# Patient Record
Sex: Male | Born: 1960 | Race: White | Hispanic: No | Marital: Married | Smoking: Never smoker
Health system: Southern US, Community
[De-identification: ages and names within clinical notes are randomized; demographics above are authoritative.]

## PROBLEM LIST (undated history)

## (undated) DIAGNOSIS — E785 Hyperlipidemia, unspecified: Secondary | ICD-10-CM

## (undated) HISTORY — PX: SHOULDER ARTHROSCOPY: SHX128

## (undated) HISTORY — PX: CHOLECYSTECTOMY: SHX55

## (undated) HISTORY — PX: VASECTOMY: SHX75

## (undated) HISTORY — DX: Hyperlipidemia, unspecified: E78.5

---

## 2009-06-15 ENCOUNTER — Emergency Department: Payer: Self-pay | Admitting: Emergency Medicine

## 2009-06-23 ENCOUNTER — Emergency Department: Payer: Self-pay | Admitting: Unknown Physician Specialty

## 2009-11-28 ENCOUNTER — Encounter: Admission: RE | Admit: 2009-11-28 | Discharge: 2009-11-28 | Payer: Self-pay | Admitting: Family Medicine

## 2009-12-18 ENCOUNTER — Ambulatory Visit (HOSPITAL_COMMUNITY): Admission: RE | Admit: 2009-12-18 | Discharge: 2009-12-18 | Payer: Self-pay | Admitting: Gastroenterology

## 2010-01-02 ENCOUNTER — Encounter: Admission: RE | Admit: 2010-01-02 | Discharge: 2010-01-02 | Payer: Self-pay | Admitting: Family Medicine

## 2010-10-02 ENCOUNTER — Ambulatory Visit (HOSPITAL_COMMUNITY)
Admission: RE | Admit: 2010-10-02 | Discharge: 2010-10-02 | Payer: Self-pay | Source: Home / Self Care | Attending: General Surgery | Admitting: General Surgery

## 2010-10-24 ENCOUNTER — Ambulatory Visit (HOSPITAL_COMMUNITY)
Admission: RE | Admit: 2010-10-24 | Discharge: 2010-10-24 | Payer: Self-pay | Source: Home / Self Care | Attending: General Surgery | Admitting: General Surgery

## 2010-11-22 NOTE — Op Note (Signed)
[name], Tyler Wyatt         ACCOUNT NO.:  [id number]  MEDICAL RECORD NO.:  [id number]          PATIENT TYPE:  AMB  LOCATION:  SDS                          FACILITY:  [facility]  PHYSICIAN:  Cherylynn Ridges, M.D.    DATE OF BIRTH:  [birth year]  DATE OF PROCEDURE:  10/24/2010 DATE OF DISCHARGE:  10/24/2010                              OPERATIVE REPORT   PREOPERATIVE DIAGNOSIS:  Symptomatic biliary dyskinesia.  POSTOPERATIVE DIAGNOSIS:  Symptomatic biliary dyskinesia.  PROCEDURE:  Laparoscopic cholecystectomy.  SURGEON:  Cherylynn Ridges, MD  ASSISTANT:  Gabrielle Dare. Janee Morn, MD  ANESTHESIA:  General endotracheal.  ESTIMATED BLOOD LOSS:  Less than 20 mL.  COMPLICATIONS:  None.  CONDITION:  Stable.  SPECIMEN:  Gallbladder with contents.  INDICATIONS FOR OPERATION:  The patient has been seen over the past several months for what was thought to be biliary dyskinesia with an initial HIDA scan demonstrating an ejection fraction of about 33-34%, but a repeat showing an ejection fraction less than 20%.  He now comes in for elective laparoscopic cholecystectomy.  FINDINGS:  No acute inflammation.  The patient had a very small cystic duct which was difficult to cannulate and therefore cholangiogram was not performed, however, he did have normal preoperative liver function tests.  OPERATION:  The patient was taken to the operating room and placed on table in supine position.  After an adequate general endotracheal anesthetic was administered, he was prepped and draped in the usual sterile manner exposing the midline and the abdomen.  An infraumbilical midline incision was made using a #15 blade and taken down to the midline fascia.  We incised the fascia with a #15 blade and grabbed the edges with the Kocher clamps tenting up while we bluntly dissected into the peritoneal cavity using Kelly clamp.  Once we were in the peritoneal cavity, a purse-string suture of 0 Vicryl was  passed around the fascial opening securing in a Hasson cannula which was subsequently passed.  We insufflated carbon dioxide gas up to a maximal intraabdominal pressure of 50 mmHg.  The patient was placed in reverse Trendelenburg, the left side was tilted down.  We passed two right upper quadrant 5-mm cannulas and a subxiphoid 5-mm cannula under direct vision.  Once this was done, we proceeded with the dissection.  The ground gallbladder was grasped with a ratcheted grasper towards the anterior abdominal wall and right upper quadrant.  We dissected out the peritoneum overlying the triangle of Calot and hepatic duodenal triangle.  We isolated the cystic duct and cystic artery.  The artery was clipped doubly on the remaining side and distally towards the gallbladder x1 and transected.  The cystic duct was clamped along the gallbladder side.  We made a cholecystodochotomy which showed a lumen with some small bilious drainage.  However, it was too small in order to cannulate with a Cook catheter which had been passed through the anterior abdominal wall.  With the patient having normal preoperative liver function tests, it was felt better not to cause a tear of the duct or any iatrogenic injury.  Therefore, we abandoned the cholangiogram, placed distal clips x3, and transected the cystic  duct.  We dissected out the gallbladder from its bed with minimal difficulty, although there was perforation of spillage of clear bile which was subsequently irrigated and aspirated away.  Approximately 2 liters of saline were used to do so.  Once along the gallbladder was completely detached from the liver, we pulled it to retrieve it from the infraumbilical cannula site with minimal difficulty.  We closed off the fascia at the infraumbilical site using the purse-string suture which was in place.  We aspirated and irrigated with 2 liters of saline.  Once this was done and effluent was clear, we aspirated  all fluid and gas from above the liver and removed all cannulas.  Marcaine 0.25% with epi was injected at all sites.  The infraumbilical skin site was closed using running subcuticular stitch of 4-0 Monocryl. Dermabond, Steri-Strips, and Tegaderm were used to complete all dressings.  All needle counts, sponge counts, and instrument counts were correct.     Cherylynn Ridges, M.D.     JOW/MEDQ  D:  10/24/2010  T:  10/24/2010  Job:  [id number]  cc:   Dr. Clovis Riley  Electronically Signed by Jimmye Norman M.D. on 11/20/2010 08:07:07 AM

## 2010-12-20 IMAGING — CR DG RIBS W/ CHEST 3+V*R*
3 series · 3 of 3 positions shown · non-contrast
Comparison: None.

CLINICAL DATA: Right rib pain.

RIGHT RIBS AND CHEST - 3+ VIEW

[w chest pa]
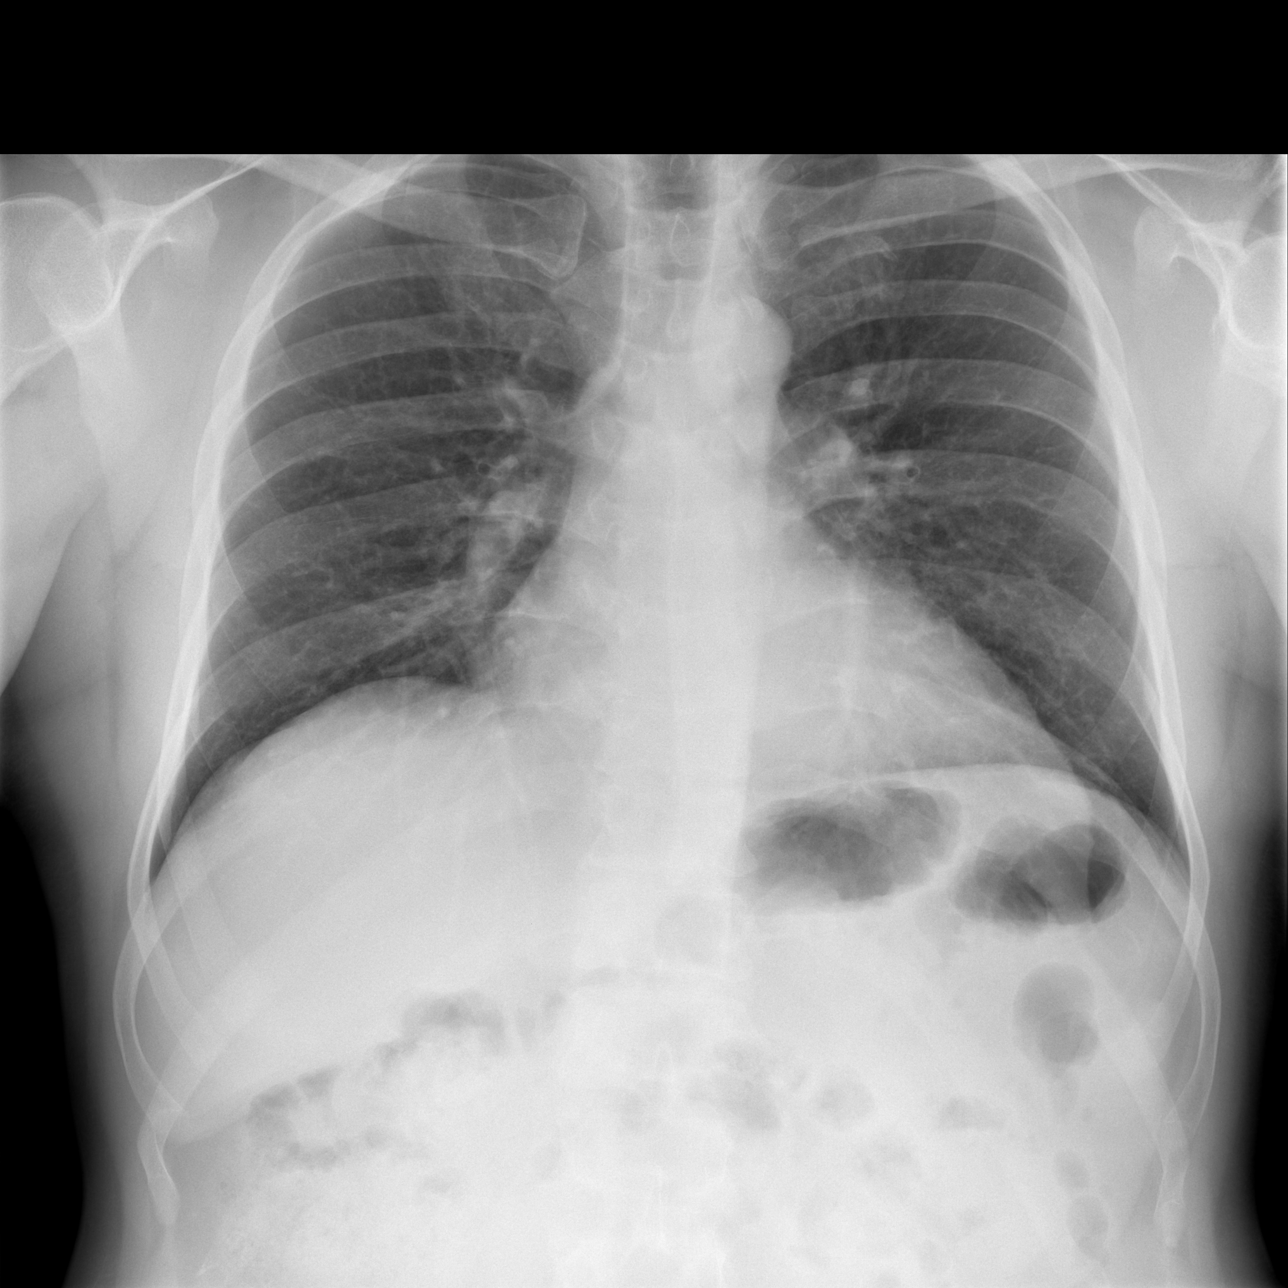

[w ribs ap/pa lower right *]
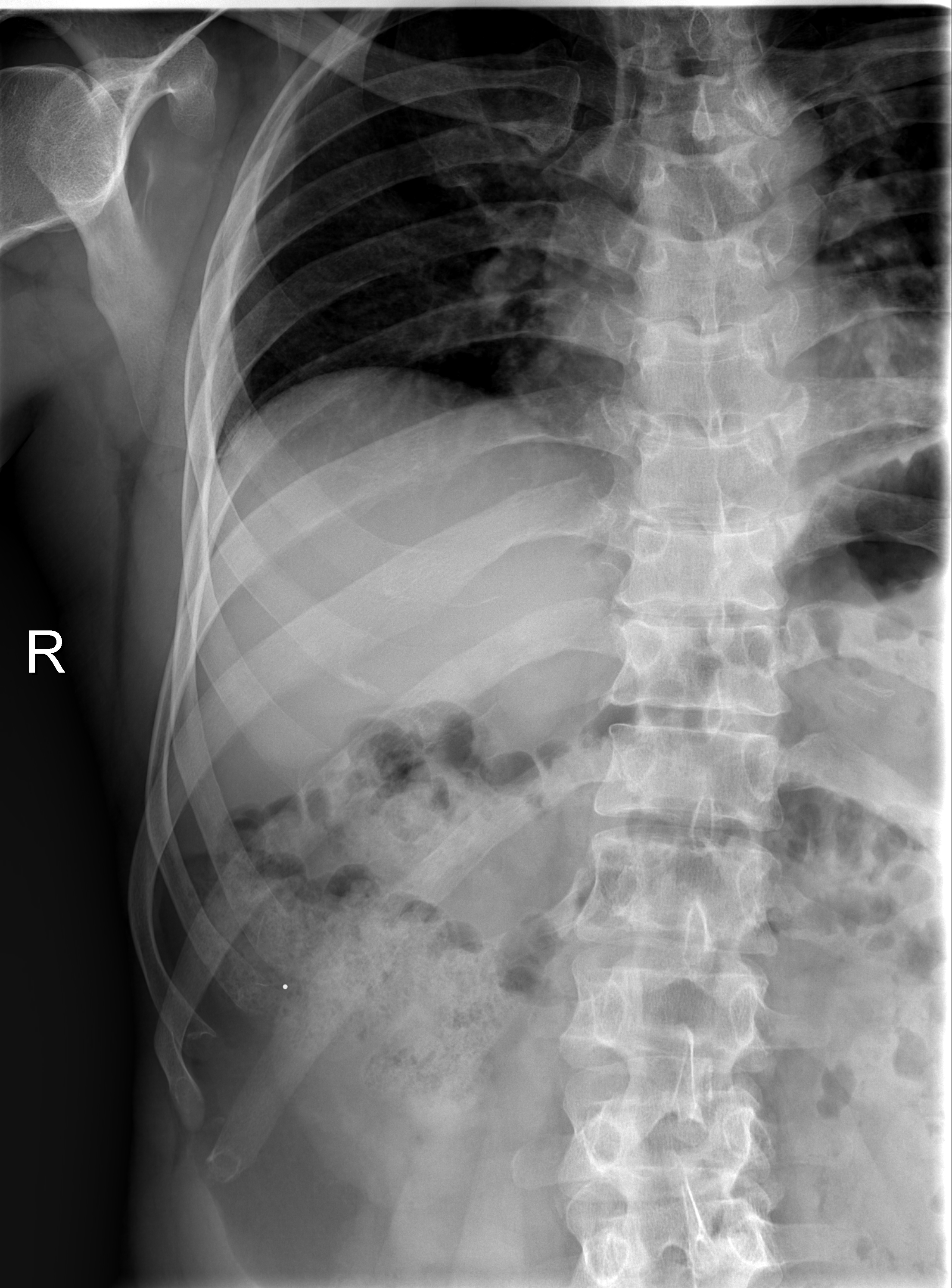

[w ribs oblique right *]
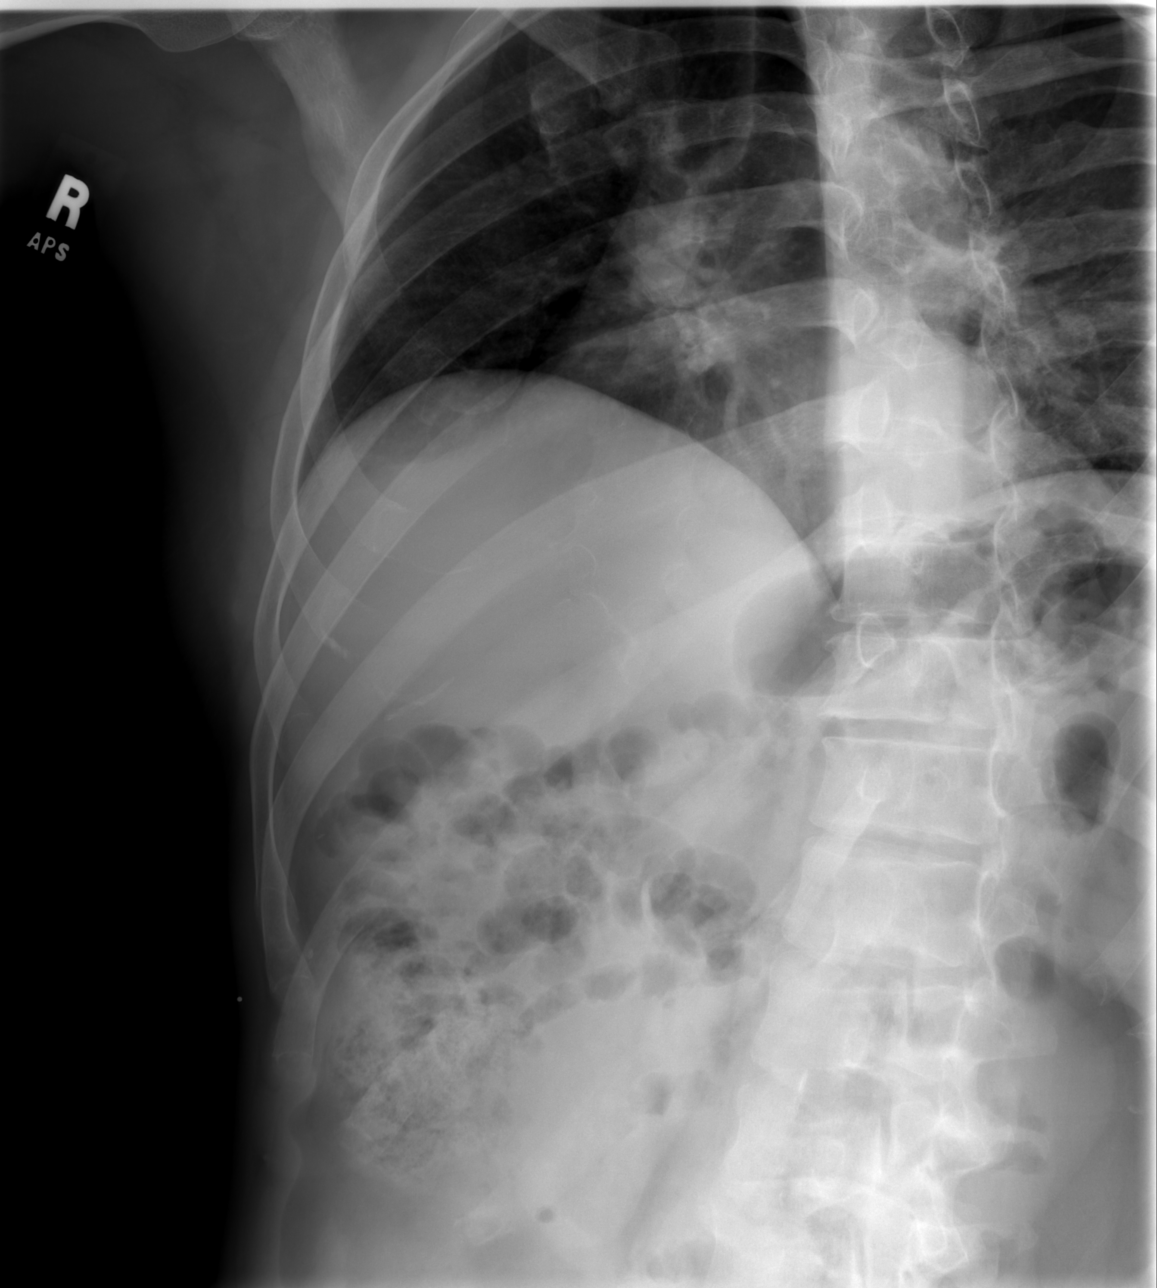

[3 of 3 positions shown; findings below may reference images not displayed]

FINDINGS: No fractures or other bone lesions are seen involve the
right ribs.  No evidence of pneumothorax or pleural effusion.  Both
lungs are clear.  Heart size is normal.
IMPRESSION: Negative.

## 2011-01-06 LAB — CBC
MCH: 29.7 pg (ref 26.0–34.0)
MCHC: 34.6 g/dL (ref 30.0–36.0)
Platelets: 199 10*3/uL (ref 150–400)
RDW: 11.9 % (ref 11.5–15.5)

## 2011-01-06 LAB — COMPREHENSIVE METABOLIC PANEL
AST: 20 U/L (ref 0–37)
Albumin: 4 g/dL (ref 3.5–5.2)
CO2: 26 mEq/L (ref 19–32)
Calcium: 9.5 mg/dL (ref 8.4–10.5)
Creatinine, Ser: 1.07 mg/dL (ref 0.4–1.5)
GFR calc Af Amer: >60 mL/min (ref >60)
GFR calc non Af Amer: >60 mL/min (ref >60)

## 2011-01-06 LAB — DIFFERENTIAL
Eosinophils Relative: 3 % (ref 0–5)
Lymphocytes Relative: 28 % (ref 12–46)
Lymphs Abs: 2.3 10*3/uL (ref 0.7–4.0)

## 2011-01-06 LAB — SURGICAL PCR SCREEN: Staphylococcus aureus: NEGATIVE

## 2011-10-04 IMAGING — CR DG CHEST 2V
2 series · 2 of 2 positions shown · non-contrast
Comparison: None

CLINICAL DATA: Preadmit biliary dyskinesia.

CHEST - 2 VIEW

[view not recorded (1 of 2)]
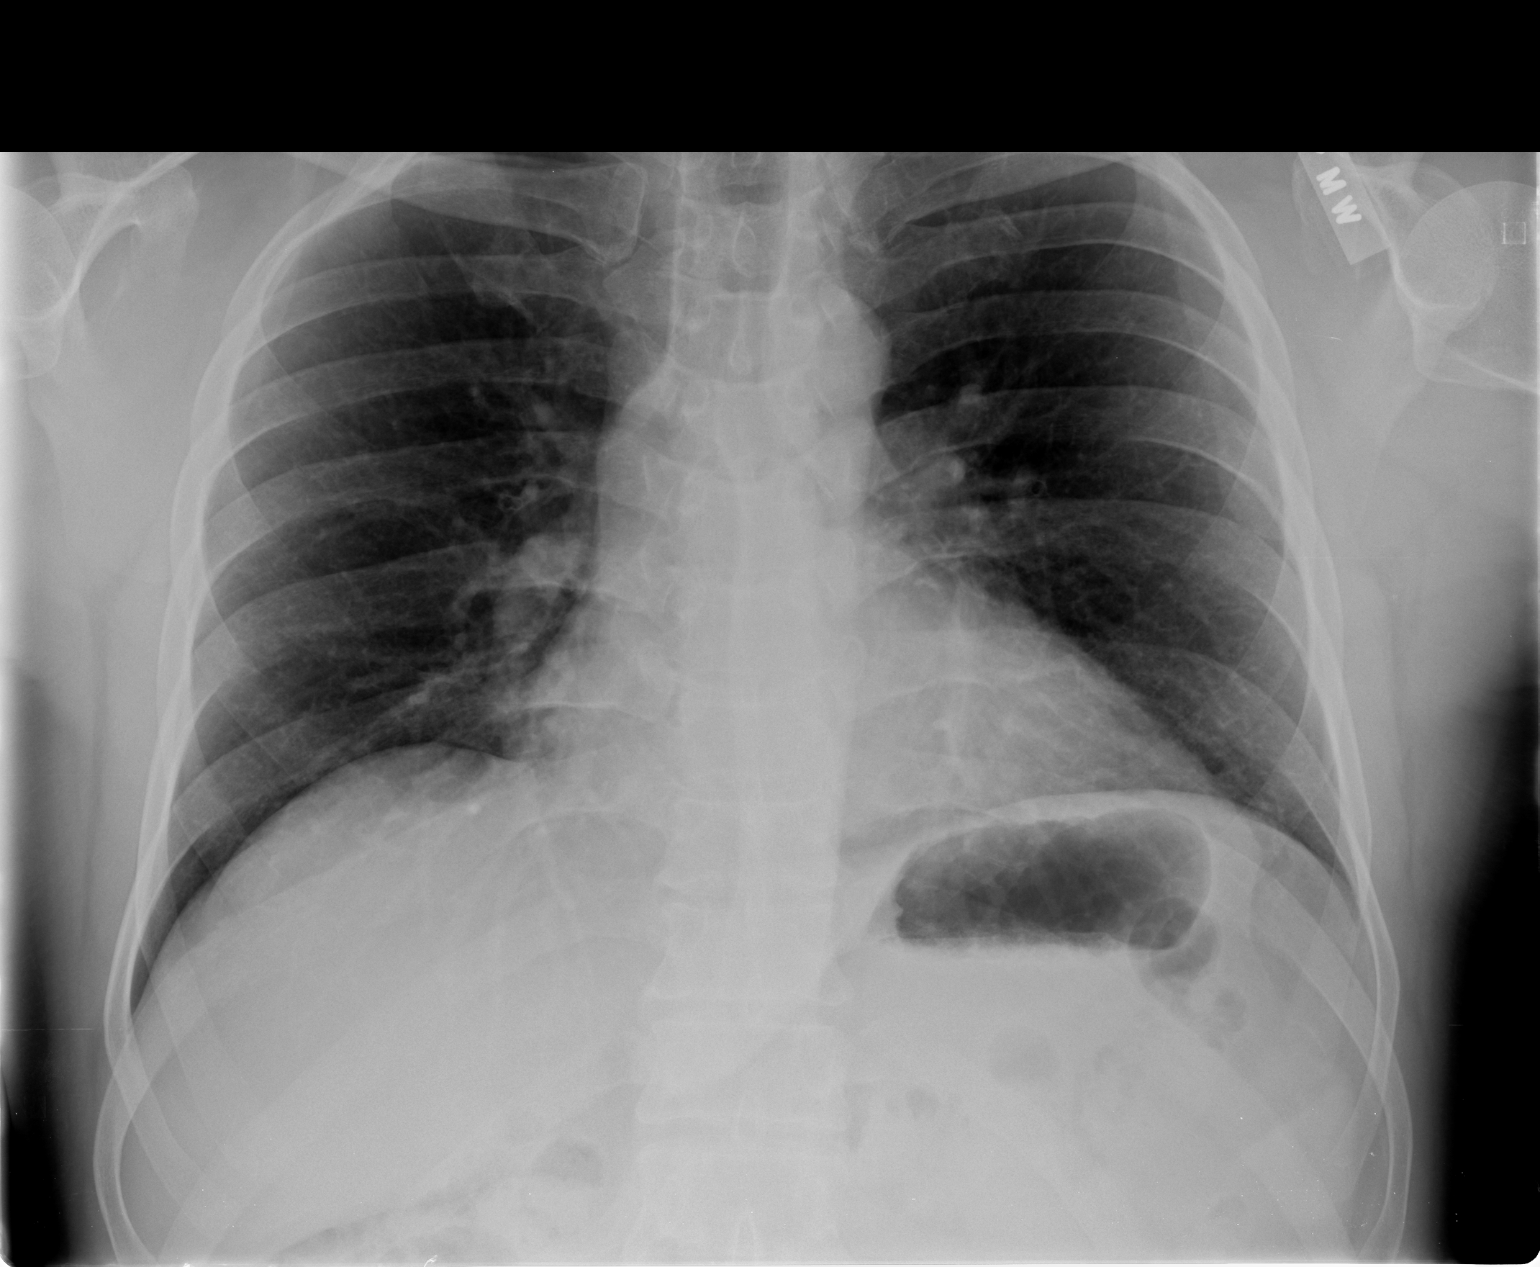

[view not recorded (2 of 2)]
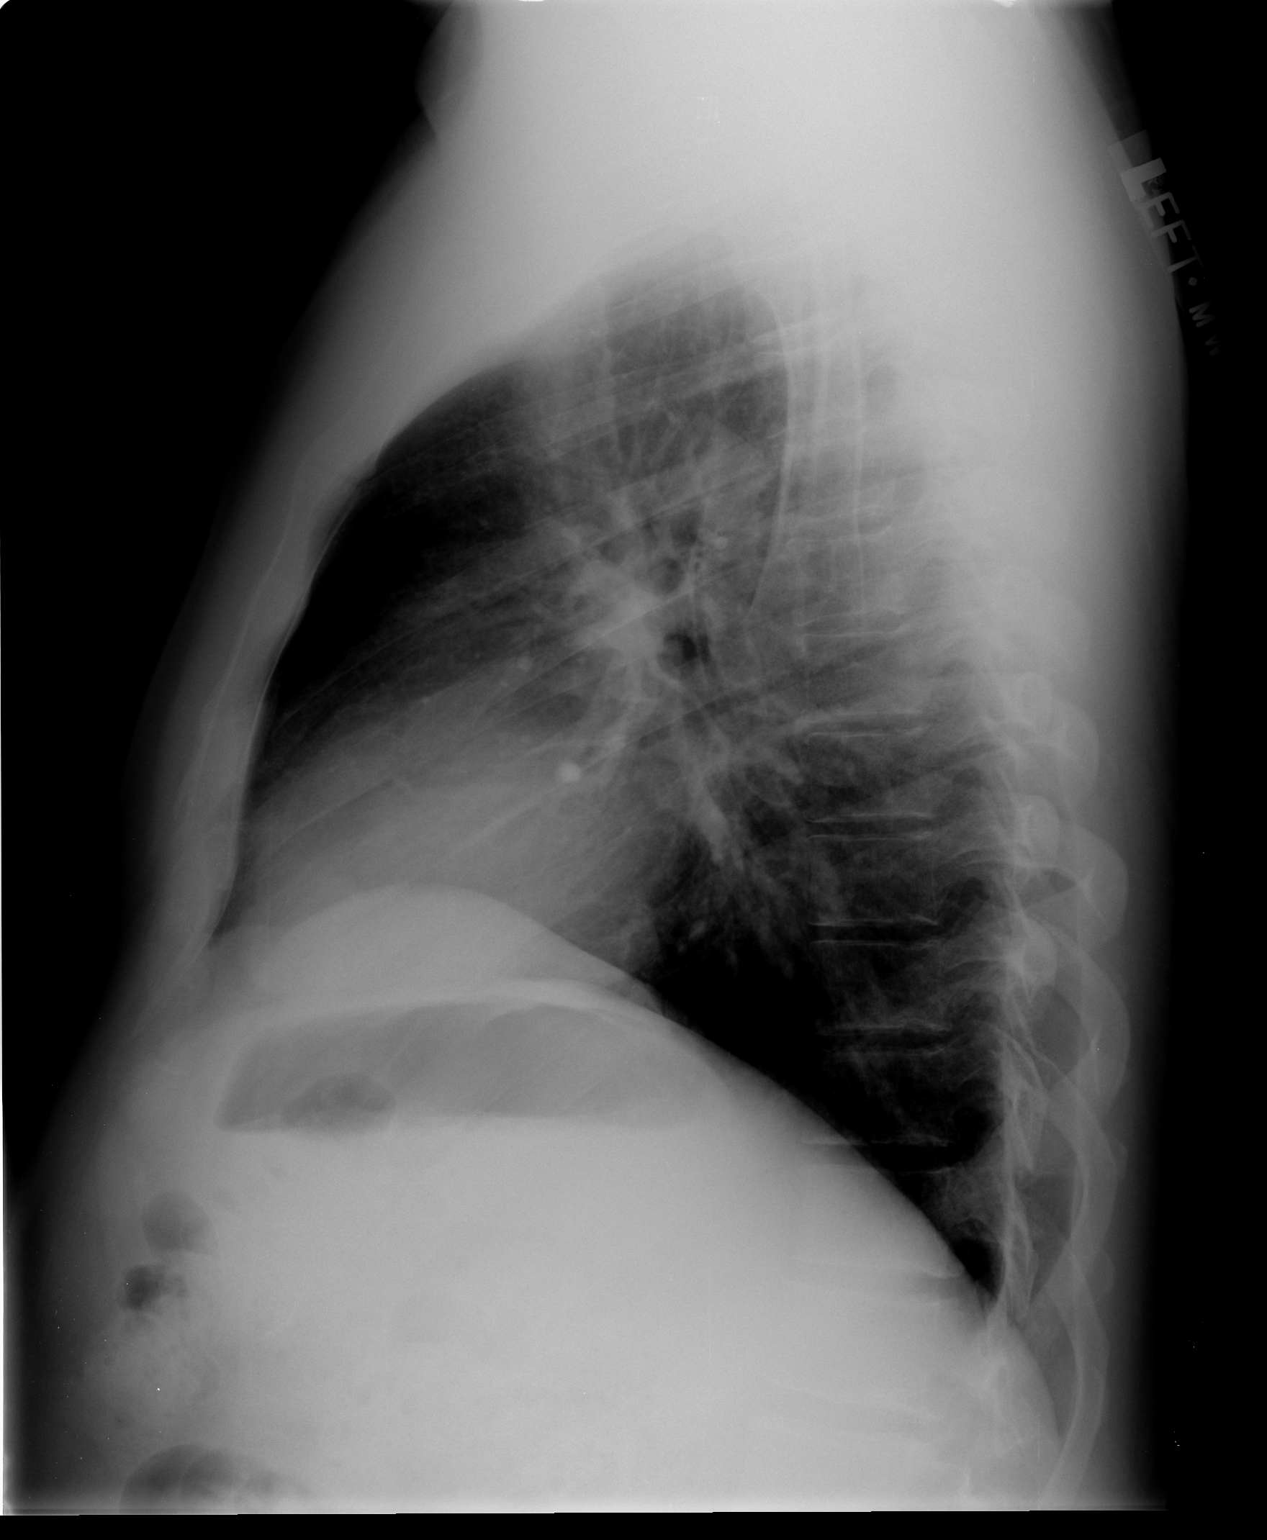

[2 of 2 positions shown; findings below may reference images not displayed]

FINDINGS: Heart size upper normal.  Negative for heart failure.
Negative for infiltrate or effusion.  Negative for mass lesion.
The lungs are clear.
IMPRESSION: No active cardiopulmonary disease.

## 2013-01-26 ENCOUNTER — Other Ambulatory Visit: Payer: Self-pay | Admitting: Gastroenterology

## 2013-07-12 ENCOUNTER — Encounter (INDEPENDENT_AMBULATORY_CARE_PROVIDER_SITE_OTHER): Payer: Self-pay | Admitting: General Surgery

## 2013-07-12 ENCOUNTER — Ambulatory Visit (INDEPENDENT_AMBULATORY_CARE_PROVIDER_SITE_OTHER): Payer: Medicare HMO | Admitting: General Surgery

## 2013-07-12 VITALS — BP 110/64 | HR 70 | Resp 16 | Ht 72.0 in | Wt 221.2 lb

## 2013-07-12 DIAGNOSIS — R1011 Right upper quadrant pain: Secondary | ICD-10-CM

## 2013-07-12 DIAGNOSIS — R109 Unspecified abdominal pain: Secondary | ICD-10-CM

## 2013-07-12 NOTE — Addendum Note (Signed)
Addended by: June Leap on: 07/12/2013 04:38 PM   Modules accepted: Orders

## 2013-07-12 NOTE — Progress Notes (Signed)
Subjective:     Patient ID: Tyler Wyatt, male   DOB: [birth year], 52 y.o.   MRN: [id number]  HPI The patient is a 52 year old male with a previous history of a laparoscopic cholecystectomy secondary to biliary dyskinesia in December of 2011. Patient had a HIDA scan with an ejection fraction near 19%. Patient states that he has a new right upper quadrant pain that is worse after meals and after dinnertime particular. He sits or some nausea or vomiting. He complains of no diarrhea.  Review of Systems  Constitutional: Negative.   HENT: Negative.   Respiratory: Negative.   Cardiovascular: Negative.   Gastrointestinal: Positive for nausea and abdominal pain (Right upper quadrant). Negative for diarrhea.  Genitourinary: Negative.   Neurological: Negative.   All other systems reviewed and are negative.       Objective:   Physical Exam  Constitutional: He is oriented to person, place, and time. He appears well-developed and well-nourished.  HENT:  Head: Normocephalic and atraumatic.  Eyes: Conjunctivae and EOM are normal. Pupils are equal, round, and reactive to light.  Neck: Normal range of motion. Neck supple.  Cardiovascular: Normal rate, regular rhythm and normal heart sounds.   Pulmonary/Chest: Effort normal and breath sounds normal.  Abdominal: Soft. Bowel sounds are normal. He exhibits no mass. There is no tenderness (Right upper quadrant.). There is no rebound and no guarding. No hernia. Hernia confirmed negative in the ventral area.  Neurological: He is alert and oriented to person, place, and time.  Skin: Skin is warm and dry.       Assessment:     52 year old male status post laparoscopic cholecystectomy in 2011 with a new right upper quadrant pain after meals.     Plan:     1. We'll have the patient referred to equal GI to evaluate for possible ulcer versus GERD versus possible hepatitis. 2. This patient did have very post laparoscopic cholecystectomy biliary  stricture however I believe it is further down in the differential.  3. The patient follow up as needed

## 2013-07-13 ENCOUNTER — Telehealth (INDEPENDENT_AMBULATORY_CARE_PROVIDER_SITE_OTHER): Payer: Self-pay | Admitting: General Surgery

## 2013-07-13 NOTE — Telephone Encounter (Signed)
Informed pt he has an appt with Dr. Bosie Clos on 07/28/13 at 2:45.

## 2013-08-11 ENCOUNTER — Other Ambulatory Visit: Payer: Self-pay | Admitting: Gastroenterology

## 2013-08-25 ENCOUNTER — Other Ambulatory Visit: Payer: Self-pay | Admitting: Gastroenterology

## 2013-08-25 DIAGNOSIS — R1011 Right upper quadrant pain: Secondary | ICD-10-CM

## 2013-08-30 ENCOUNTER — Ambulatory Visit
Admission: RE | Admit: 2013-08-30 | Discharge: 2013-08-30 | Disposition: A | Discharge status: Home / Self Care | Payer: 59 | Source: Ambulatory Visit | Attending: Gastroenterology | Admitting: Gastroenterology

## 2013-08-30 DIAGNOSIS — R1011 Right upper quadrant pain: Secondary | ICD-10-CM

## 2013-08-30 MED ORDER — IOHEXOL 300 MG/ML  SOLN
125.0000 mL | Freq: Once | INTRAMUSCULAR | Status: AC | PRN
  Administered 2013-08-30: 125 mL via INTRAVENOUS

## 2013-09-14 ENCOUNTER — Other Ambulatory Visit: Payer: Self-pay | Admitting: Family Medicine

## 2013-09-14 DIAGNOSIS — R911 Solitary pulmonary nodule: Secondary | ICD-10-CM

## 2013-09-19 ENCOUNTER — Ambulatory Visit
Admission: RE | Admit: 2013-09-19 | Discharge: 2013-09-19 | Disposition: A | Discharge status: Home / Self Care | Payer: No Typology Code available for payment source | Source: Ambulatory Visit | Attending: Family Medicine | Admitting: Family Medicine

## 2013-09-19 DIAGNOSIS — R911 Solitary pulmonary nodule: Secondary | ICD-10-CM

## 2013-09-19 MED ORDER — IOHEXOL 300 MG/ML  SOLN
75.0000 mL | Freq: Once | INTRAMUSCULAR | Status: AC | PRN
  Administered 2013-09-19: 75 mL via INTRAVENOUS

## 2017-01-07 ENCOUNTER — Other Ambulatory Visit: Payer: Self-pay | Admitting: Unknown Physician Specialty

## 2017-01-07 DIAGNOSIS — M25562 Pain in left knee: Secondary | ICD-10-CM

## 2017-01-21 ENCOUNTER — Ambulatory Visit
Admission: RE | Admit: 2017-01-21 | Discharge: 2017-01-21 | Disposition: A | Discharge status: Home / Self Care | Payer: BC Managed Care – PPO | Source: Ambulatory Visit | Attending: Unknown Physician Specialty | Admitting: Unknown Physician Specialty

## 2017-01-21 DIAGNOSIS — M25562 Pain in left knee: Secondary | ICD-10-CM | POA: Diagnosis present

## 2017-01-21 DIAGNOSIS — M659 Synovitis and tenosynovitis, unspecified: Secondary | ICD-10-CM | POA: Diagnosis not present

## 2017-01-21 DIAGNOSIS — M1712 Unilateral primary osteoarthritis, left knee: Secondary | ICD-10-CM | POA: Insufficient documentation

## 2017-01-21 DIAGNOSIS — X58XXXA Exposure to other specified factors, initial encounter: Secondary | ICD-10-CM | POA: Diagnosis not present

## 2017-01-21 DIAGNOSIS — M7122 Synovial cyst of popliteal space [Baker], left knee: Secondary | ICD-10-CM | POA: Insufficient documentation

## 2017-01-21 DIAGNOSIS — M25461 Effusion, right knee: Secondary | ICD-10-CM | POA: Insufficient documentation

## 2017-01-21 DIAGNOSIS — M94262 Chondromalacia, left knee: Secondary | ICD-10-CM | POA: Diagnosis not present

## 2017-01-21 DIAGNOSIS — S83242A Other tear of medial meniscus, current injury, left knee, initial encounter: Secondary | ICD-10-CM | POA: Insufficient documentation

## 2017-01-30 ENCOUNTER — Encounter: Payer: Self-pay | Admitting: *Deleted

## 2017-02-06 ENCOUNTER — Ambulatory Visit
Admission: RE | Admit: 2017-02-06 | Discharge: 2017-02-06 | Disposition: A | Discharge status: Home / Self Care | Payer: BC Managed Care – PPO | Source: Ambulatory Visit | Attending: Unknown Physician Specialty | Admitting: Unknown Physician Specialty

## 2017-02-06 ENCOUNTER — Encounter
Admission: RE | Disposition: A | Discharge status: Home / Self Care | Payer: Self-pay | Source: Ambulatory Visit | Attending: Unknown Physician Specialty

## 2017-02-06 ENCOUNTER — Ambulatory Visit: Payer: BC Managed Care – PPO | Admitting: Student in an Organized Health Care Education/Training Program

## 2017-02-06 DIAGNOSIS — E785 Hyperlipidemia, unspecified: Secondary | ICD-10-CM | POA: Diagnosis not present

## 2017-02-06 DIAGNOSIS — Z9852 Vasectomy status: Secondary | ICD-10-CM | POA: Diagnosis not present

## 2017-02-06 DIAGNOSIS — Z7951 Long term (current) use of inhaled steroids: Secondary | ICD-10-CM | POA: Insufficient documentation

## 2017-02-06 DIAGNOSIS — M1712 Unilateral primary osteoarthritis, left knee: Secondary | ICD-10-CM | POA: Diagnosis not present

## 2017-02-06 DIAGNOSIS — Z791 Long term (current) use of non-steroidal anti-inflammatories (NSAID): Secondary | ICD-10-CM | POA: Diagnosis not present

## 2017-02-06 DIAGNOSIS — Z9889 Other specified postprocedural states: Secondary | ICD-10-CM | POA: Insufficient documentation

## 2017-02-06 DIAGNOSIS — M23204 Derangement of unspecified medial meniscus due to old tear or injury, left knee: Secondary | ICD-10-CM | POA: Insufficient documentation

## 2017-02-06 DIAGNOSIS — Z79899 Other long term (current) drug therapy: Secondary | ICD-10-CM | POA: Diagnosis not present

## 2017-02-06 HISTORY — PX: KNEE ARTHROSCOPY: SHX127

## 2017-02-06 SURGERY — ARTHROSCOPY, KNEE
Anesthesia: General | Laterality: Left | Wound class: Clean

## 2017-02-06 MED ORDER — PROPOFOL 10 MG/ML IV BOLUS
INTRAVENOUS | Status: DC | PRN
  Administered 2017-02-06: 200 mg via INTRAVENOUS

## 2017-02-06 MED ORDER — DEXAMETHASONE SODIUM PHOSPHATE 4 MG/ML IJ SOLN
INTRAMUSCULAR | Status: DC | PRN
  Administered 2017-02-06: 4 mg via INTRAVENOUS

## 2017-02-06 MED ORDER — LIDOCAINE HCL (CARDIAC) 20 MG/ML IV SOLN
INTRAVENOUS | Status: DC | PRN
  Administered 2017-02-06: 40 mg via INTRATRACHEAL

## 2017-02-06 MED ORDER — ACETAMINOPHEN 10 MG/ML IV SOLN
INTRAVENOUS | Status: DC | PRN
  Administered 2017-02-06: 1000 mg via INTRAVENOUS

## 2017-02-06 MED ORDER — HYDROCODONE-ACETAMINOPHEN 5-325 MG PO TABS: 1.0000 | ORAL_TABLET | Freq: Four times a day (QID) | ORAL | 0 refills | Status: DC | PRN

## 2017-02-06 MED ORDER — GLYCOPYRROLATE 0.2 MG/ML IJ SOLN
INTRAMUSCULAR | Status: DC | PRN
  Administered 2017-02-06: 0.2 mg via INTRAVENOUS

## 2017-02-06 MED ORDER — OXYCODONE HCL 5 MG PO TABS
5.0000 mg | ORAL_TABLET | Freq: Once | ORAL | Status: AC | PRN
  Administered 2017-02-06: 5 mg via ORAL

## 2017-02-06 MED ORDER — FENTANYL CITRATE (PF) 100 MCG/2ML IJ SOLN: 25.0000 ug | INTRAMUSCULAR | Status: DC | PRN

## 2017-02-06 MED ORDER — MIDAZOLAM HCL 5 MG/5ML IJ SOLN
INTRAMUSCULAR | Status: DC | PRN
  Administered 2017-02-06: 2 mg via INTRAVENOUS

## 2017-02-06 MED ORDER — FENTANYL CITRATE (PF) 100 MCG/2ML IJ SOLN
INTRAMUSCULAR | Status: DC | PRN
  Administered 2017-02-06 (×6): 25 ug via INTRAVENOUS

## 2017-02-06 MED ORDER — BUPIVACAINE HCL (PF) 0.5 % IJ SOLN
INTRAMUSCULAR | Status: DC | PRN
  Administered 2017-02-06: 20 mL

## 2017-02-06 MED ORDER — OXYCODONE HCL 5 MG/5ML PO SOLN: 5.0000 mg | Freq: Once | ORAL | Status: AC | PRN

## 2017-02-06 MED ORDER — ACETAMINOPHEN 325 MG PO TABS: 325.0000 mg | ORAL_TABLET | ORAL | Status: DC | PRN

## 2017-02-06 MED ORDER — ONDANSETRON HCL 4 MG/2ML IJ SOLN
INTRAMUSCULAR | Status: DC | PRN
  Administered 2017-02-06: 4 mg via INTRAVENOUS

## 2017-02-06 MED ORDER — ACETAMINOPHEN 160 MG/5ML PO SOLN: 325.0000 mg | ORAL | Status: DC | PRN

## 2017-02-06 MED ORDER — LACTATED RINGERS IV SOLN
INTRAVENOUS | Status: DC
Start: 2017-02-06 — End: 2017-02-06
  Administered 2017-02-06 (×3): via INTRAVENOUS

## 2017-02-06 SURGICAL SUPPLY — 51 items
ARTHROWAND PARAGON T2 (SURGICAL WAND)
BLADE ABRADER 4.5 (BLADE) IMPLANT
BLADE FULL RADIUS 3.5 (BLADE) IMPLANT
BLADE SHAVER 4.5X7 STR FR (MISCELLANEOUS) ×2 IMPLANT
BLADE SHAVER AGGRES 5.5  STR (CUTTER)
BLADE SHAVER AGGRES 5.5 STR (CUTTER) IMPLANT
BNDG ESMARK 6X12 TAN STRL LF (GAUZE/BANDAGES/DRESSINGS) IMPLANT
BUR 5.5 NOTCHBLASTER STR (BURR) IMPLANT
BUR ABRADER 4.0 W/FLUTE AQUA (MISCELLANEOUS) IMPLANT
BUR ABRADER 5.5 BLK (MISCELLANEOUS) ×1 IMPLANT
BUR ACROMIONIZER 4.0 (BURR) IMPLANT
BUR BR 5.5 WIDE MOUTH (BURR) IMPLANT
BUR RADIUS 3.5 (BURR) IMPLANT
BUR RADIUS 4.0X18.5 (BURR) IMPLANT
BUR ROUND 5.5 (BURR) IMPLANT
BURR 5.5 NOTCHBLASTER STR (BURR)
BURR ABRADER 4.0 W/FLUTE AQUA (MISCELLANEOUS)
BURR ABRADER 5.5 BLK (MISCELLANEOUS) ×2
BURR ROUND 12 FLUTE 4.0MM (BURR) IMPLANT
CANNULA THRD 8.5X72 DISP (CANNULA) IMPLANT
COVER LIGHT HANDLE UNIVERSAL (MISCELLANEOUS) ×4 IMPLANT
CUFF TOURN SGL QUICK 30 (MISCELLANEOUS) ×1
CUFF TOURN SGL QUICK 34 (TOURNIQUET CUFF)
CUFF TRNQT CYL 34X4X40X1 (TOURNIQUET CUFF) IMPLANT
CUFF TRNQT CYL LO 30X4X (MISCELLANEOUS) ×1 IMPLANT
CUTTER SLOTTED WHISKER 4.0 (BURR) IMPLANT
DRAPE LEGGINS SURG 28X43 STRL (DRAPES) ×2 IMPLANT
DURAPREP 26ML APPLICATOR (WOUND CARE) ×2 IMPLANT
GAUZE SPONGE 4X4 12PLY STRL (GAUZE/BANDAGES/DRESSINGS) ×2 IMPLANT
GLOVE BIO SURGEON STRL SZ7.5 (GLOVE) ×2 IMPLANT
GLOVE BIO SURGEON STRL SZ8 (GLOVE) ×2 IMPLANT
GLOVE INDICATOR 8.0 STRL GRN (GLOVE) ×2 IMPLANT
GOWN STRL REIN 2XL XLG LVL4 (GOWN DISPOSABLE) ×4 IMPLANT
GOWN STRL REUS W/TWL 2XL LVL3 (GOWN DISPOSABLE) IMPLANT
IV LACTATED RINGER IRRG 3000ML (IV SOLUTION) ×2
IV LR IRRIG 3000ML ARTHROMATIC (IV SOLUTION) ×2 IMPLANT
KIT ROOM TURNOVER OR (KITS) ×2 IMPLANT
MANIFOLD 4PT FOR NEPTUNE1 (MISCELLANEOUS) ×2 IMPLANT
PACK ARTHROSCOPY KNEE (MISCELLANEOUS) ×2 IMPLANT
SET TUBE SUCT SHAVER OUTFL 24K (TUBING) ×2 IMPLANT
SOL PREP PVP 2OZ (MISCELLANEOUS) ×2
SOLUTION PREP PVP 2OZ (MISCELLANEOUS) ×1 IMPLANT
SUT ETHILON 3-0 FS-10 30 BLK (SUTURE) ×2
SUTURE EHLN 3-0 FS-10 30 BLK (SUTURE) ×1 IMPLANT
TAPE MICROFOAM 4IN (TAPE) ×2 IMPLANT
TUBING ARTHRO INFLOW-ONLY STRL (TUBING) ×2 IMPLANT
WAND ARTHRO PARAGON T2 (SURGICAL WAND) IMPLANT
WAND HAND CNTRL MULTIVAC 50 (MISCELLANEOUS) IMPLANT
WAND HAND CNTRL MULTIVAC 90 (MISCELLANEOUS) IMPLANT
WAND MEGAVAC 90 (MISCELLANEOUS) IMPLANT
WRAP KNEE W/COLD PACKS 25.5X14 (SOFTGOODS) ×2 IMPLANT

## 2017-02-06 NOTE — Anesthesia Preprocedure Evaluation (Signed)
Anesthesia Evaluation  Patient identified by MRN, date of birth, ID band Patient awake    Reviewed: Allergy & Precautions, H&P , NPO status , Patient's Chart, lab work & pertinent test results  Airway Mallampati: II  TM Distance: >3 FB Neck ROM: full    Dental  (+) Teeth Intact   Pulmonary neg pulmonary ROS,    breath sounds clear to auscultation       Cardiovascular negative cardio ROS   Rhythm:regular Rate:Normal     Neuro/Psych negative neurological ROS     GI/Hepatic negative GI ROS,   Endo/Other  negative endocrine ROS  Renal/GU      Musculoskeletal   Abdominal   Peds  Hematology negative hematology ROS (+)   Anesthesia Other Findings   Reproductive/Obstetrics                             Anesthesia Physical Anesthesia Plan  ASA: I  Anesthesia Plan: General LMA   Post-op Pain Management:    Induction:   Airway Management Planned:   Additional Equipment:   Intra-op Plan:   Post-operative Plan:   Informed Consent: I have reviewed the patients History and Physical, chart, labs and discussed the procedure including the risks, benefits and alternatives for the proposed anesthesia with the patient or authorized representative who has indicated his/her understanding and acceptance.     Plan Discussed with: CRNA  Anesthesia Plan Comments:         Anesthesia Quick Evaluation

## 2017-02-06 NOTE — Transfer of Care (Signed)
Immediate Anesthesia Transfer of Care Note  Patient: Tyler Wyatt  Procedure(s) Performed: Procedure(s) with comments: ARTHROSCOPY KNEE (Left) - Rob Bagwell/ [employer]  Patient Location: PACU  Anesthesia Type: General LMA  Level of Consciousness: awake, alert  and patient cooperative  Airway and Oxygen Therapy: Patient Spontanous Breathing and Patient connected to supplemental oxygen  Post-op Assessment: Post-op Vital signs reviewed, Patient's Cardiovascular Status Stable, Respiratory Function Stable, Patent Airway and No signs of Nausea or vomiting  Post-op Vital Signs: Reviewed and stable  Complications: No apparent anesthesia complications

## 2017-02-06 NOTE — H&P (Signed)
  H and P reviewed. No changes. Uploaded at later date. 

## 2017-02-06 NOTE — Anesthesia Procedure Notes (Signed)
Procedure Name: LMA Insertion Performed by: Kaceton Vieau Pre-anesthesia Checklist: Patient identified, Emergency Drugs available, Suction available, Patient being monitored and Timeout performed Patient Re-evaluated:Patient Re-evaluated prior to inductionOxygen Delivery Method: Circle system utilized Preoxygenation: Pre-oxygenation with 100% oxygen Intubation Type: IV induction LMA: LMA inserted LMA Size: 4.0 Number of attempts: 1 Tube secured with: Tape       

## 2017-02-06 NOTE — Op Note (Signed)
Patient: Tyler Wyatt, Tyler Wyatt.  Preoperative diagnosis: Torn medial meniscus left knee along with medial compartment chondral lesions  Postop diagnosis: Same  Operation: Arthroscopic partial medial meniscectomy left knee plus chondral debridement and coblation  Surgeon: Randon Goldsmith, MD  Anesthesia: Gen.   History: Patient's had a long history of left knee pain.  The plain films revealed minimal narrowing of the medial compartment .  The patient had an MRI which revealed a torn medial meniscus plus medial compartment degenerative changes.The patient was scheduled for surgery due to persistent discomfort despite conservative treatment.  The patient was taken the operating room where satisfactory general anesthesia was achieved. A tourniquet and leg holder were was applied to the left thigh. A well leg support was applied to the nonoperative extremity. The left knee was prepped and draped in usual fashion for an arthroscopic procedure. An inflow cannula was introduced superomedially. The joint was distended with lactated Ringer's. Scope was introduced through an inferolateral puncture wound and a probe through an inferomedial puncture wound. Inspection of the medial compartment revealed  complex tear of the middle third of the medial meniscus plus grade 3 chondral pathology in the medial femoral condyle and tibial plateau. I went ahead and resected the tear using a combination of a motorized resector and basket biters. The remaining rim was contoured with an ArthroCare radiofrequency wand. I then coblated the medial femoral chondral lesions with the ArthroCare wand. I debrided the medial tibial plateau chondral lesions with a turbo whisker. Inspection of the intercondylar notch revealed intact cruciates. Inspection of the the lateral compartment revealed no meniscal or chondral pathology.   Trochlear groove was inspected and appeared to be fairly smooth.  Inspection of the retropatellar surface  revealed no significant degenerative changes. I observed patella tracking from the inferolateral portal. The patella seemed to track fairly well.  The instruments were removed from the joint at this time. The puncture wounds were closed with 3-0 nylon in vertical mattress fashion. I injected each puncture wound with several cc of half percent Marcaine without epinephrine. Betadine was applied the wounds followed by sterile dressing. An ice pack was applied to the right knee. The patient was awakened and transferred to the stretcher bed. The patient was taken to the recovery room in satisfactory condition.  The tourniquet was not inflated during the course of the procedure. Blood loss was negligible.

## 2017-02-06 NOTE — Anesthesia Postprocedure Evaluation (Signed)
Anesthesia Post Note  Patient: Tyler Wyatt  Procedure(s) Performed: Procedure(s) (LRB): ARTHROSCOPY KNEE With partial medial meniscectomy left knee plus chondral debridement and coblation (Left)  Patient location during evaluation: PACU Anesthesia Type: General Level of consciousness: awake and alert Pain management: pain level controlled Vital Signs Assessment: post-procedure vital signs reviewed and stable Respiratory status: spontaneous breathing, nonlabored ventilation and respiratory function stable Cardiovascular status: stable Postop Assessment: no signs of nausea or vomiting Anesthetic complications: no    Jola Babinski

## 2017-02-06 NOTE — Discharge Instructions (Signed)
° °[facility]  °Tyler Wyatt B. Tyler Wyatt, [name], M.D. [phone]  ° °KNEE ARTHROSCOPY POST OPERATION INSTRUCTIONS: ° °PLEASE READ THESE INSTRUCTIONS ABOUT POST OPERATION CARE. THEY WILL ANSWER MOST OF YOUR QUESTIONS.  °You have been given a prescription for pain. Please take as directed for pain.  °You can walk, keeping the knee slightly stiff-avoid doing too much bending the first day. (if ACL reconstruction is performed, keep brace locked in extension when walking.)  °You will use crutches or cane if needed. Can weight bear as tolerated  °Plan to take three to four days off from work. You can resume work when you are comfortable. (This can be a week or more, depending on the type of work you do.)  °To reduce pain and swelling, place one to two pillows under the knee the first two or three days when sitting or lying. An ice pack may be placed on top of the area over the dressing. Instructions for making homemade icepack are as follow:  °Flexible homemade alcohol water ice pack  °2 cups water  °1 cup rubbing alcohol  °food coloring for the blue tint (optional)  °2 zip-top bags - gallon-size  °Mix the water and alcohol together in one of your zip-top bags and add food coloring. Release as much air as possible and seal the bag. Place in freezer for at least 12 hours.  °The small incisions in your knee are closed with nylon stitches. They will be removed in the office.  °The bulky dressing may be removed in the third day after surgery. (If ACL surgery-DO NOT REMOVE BANDAGES). Put a waterproof band-aid over each stitch. Do not put any creams or ointments on wounds. You may shower at this time, but change waterproof band-aids after showering. KEEP INCISIONS CLEAN AND DRY UNTIL YOU RETURN TO THE OFFICE.  °Sometimes the operative area remains somewhat painful and swollen for several weeks. This is usually nothing to worry about, but call if you have any excessive symptoms, especially  fever. It is not unusual to have a low grade fever of 99 degrees for the first few days. If persist after 3-4 days call the office. It is not uncommon for the pain to be a little worse on the third day after surgery.  °Begin doing gentle exercises right away. They will be limited by the amount of pain and swelling you have.  Exercising will reduce the swelling, increase motion, and prevent muscle weakness. Exercises: Straight leg raising and gentle knee bending.  °Take a 325 milligram aspirin or Bufferin tablet twice a day for 2 weeks after meals or milk. This along with elevation will help reduce the possibility of phlebitis in your operated leg.  °Avoid strenuous athletics for a minimum of 4 to 6 weeks after arthroscopic surgery (approximately five months if ACL surgery).  °If the surgery included ACL reconstruction the brace that is supplied to the extremity post surgery is to be locked in extension when you are asleep and is to be locked in extension when you are ambulating. It can be unlocked for exercises or sitting.  °Keep your post surgery appointment that has been made for you. If you do not remember the date call [phone]. Your follow up appointment should be between 7-10 days.  °General Anesthesia, Adult, Care After °These instructions provide you with information about caring for yourself after your procedure. Your health care provider may also give you more specific instructions. Your treatment has been planned according to current   medical practices, but problems sometimes occur. Call your health care provider if you have any problems or questions after your procedure. °What can I expect after the procedure? °After the procedure, it is common to have: °· Vomiting. °· A sore throat. °· Mental slowness. °It is common to feel: °· Nauseous. °· Cold or shivery. °· Sleepy. °· Tired. °· Sore or achy, even in parts of your body where you did not have surgery. °Follow these instructions at home: °For at  least 24 hours after the procedure:  °· Do not: °¨ Participate in activities where you could fall or become injured. °¨ Drive. °¨ Use heavy machinery. °¨ Drink alcohol. °¨ Take sleeping pills or medicines that cause drowsiness. °¨ Make important decisions or sign legal documents. °¨ Take care of children on your own. °· Rest. °Eating and drinking  °· If you vomit, drink water, juice, or soup when you can drink without vomiting. °· Drink enough fluid to keep your urine clear or pale yellow. °· Make sure you have little or no nausea before eating solid foods. °· Follow the diet recommended by your health care provider. °General instructions  °· Have a responsible adult stay with you until you are awake and alert. °· Return to your normal activities as told by your health care provider. Ask your health care provider what activities are safe for you. °· Take over-the-counter and prescription medicines only as told by your health care provider. °· If you smoke, do not smoke without supervision. °· Keep all follow-up visits as told by your health care provider. This is important. °Contact a health care provider if: °· You continue to have nausea or vomiting at home, and medicines are not helpful. °· You cannot drink fluids or start eating again. °· You cannot urinate after 8-12 hours. °· You develop a skin rash. °· You have fever. °· You have increasing redness at the site of your procedure. °Get help right away if: °· You have difficulty breathing. °· You have chest pain. °· You have unexpected bleeding. °· You feel that you are having a life-threatening or urgent problem. °This information is not intended to replace advice given to you by your health care provider. Make sure you discuss any questions you have with your health care provider. °Document Released: 01/19/2001 Document Revised: 03/17/2016 Document Reviewed: 09/27/2015 °Elsevier Interactive Patient Education © 2017 [employer] ° °

## 2017-02-09 ENCOUNTER — Encounter: Payer: Self-pay | Admitting: Unknown Physician Specialty

## 2018-01-25 ENCOUNTER — Other Ambulatory Visit: Payer: Self-pay | Admitting: Unknown Physician Specialty

## 2018-01-25 DIAGNOSIS — M7989 Other specified soft tissue disorders: Secondary | ICD-10-CM

## 2018-01-25 DIAGNOSIS — M79662 Pain in left lower leg: Secondary | ICD-10-CM

## 2018-01-25 DIAGNOSIS — Z96652 Presence of left artificial knee joint: Secondary | ICD-10-CM

## 2018-01-26 ENCOUNTER — Ambulatory Visit
Admission: RE | Admit: 2018-01-26 | Discharge: 2018-01-26 | Disposition: A | Discharge status: Home / Self Care | Payer: BC Managed Care – PPO | Source: Ambulatory Visit | Attending: Unknown Physician Specialty | Admitting: Unknown Physician Specialty

## 2018-01-26 DIAGNOSIS — M7989 Other specified soft tissue disorders: Secondary | ICD-10-CM | POA: Diagnosis not present

## 2018-01-26 DIAGNOSIS — M79662 Pain in left lower leg: Secondary | ICD-10-CM | POA: Insufficient documentation

## 2018-01-26 DIAGNOSIS — Z96652 Presence of left artificial knee joint: Secondary | ICD-10-CM | POA: Insufficient documentation

## 2018-09-17 ENCOUNTER — Other Ambulatory Visit: Payer: Self-pay | Admitting: Unknown Physician Specialty

## 2018-09-17 DIAGNOSIS — Z96652 Presence of left artificial knee joint: Secondary | ICD-10-CM

## 2018-10-09 ENCOUNTER — Ambulatory Visit
Admission: RE | Admit: 2018-10-09 | Discharge: 2018-10-09 | Disposition: A | Discharge status: Home / Self Care | Payer: BC Managed Care – PPO | Source: Ambulatory Visit | Attending: Unknown Physician Specialty | Admitting: Unknown Physician Specialty

## 2018-10-09 DIAGNOSIS — M25562 Pain in left knee: Secondary | ICD-10-CM | POA: Diagnosis present

## 2018-10-09 DIAGNOSIS — M25462 Effusion, left knee: Secondary | ICD-10-CM | POA: Insufficient documentation

## 2018-10-09 DIAGNOSIS — Z96652 Presence of left artificial knee joint: Secondary | ICD-10-CM | POA: Insufficient documentation

## 2018-10-22 ENCOUNTER — Ambulatory Visit
Admission: RE | Admit: 2018-10-22 | Discharge: 2018-10-22 | Disposition: A | Discharge status: Home / Self Care | Payer: BC Managed Care – PPO | Attending: Unknown Physician Specialty | Admitting: Unknown Physician Specialty

## 2018-10-22 ENCOUNTER — Encounter
Admission: RE | Disposition: A | Discharge status: Home / Self Care | Payer: Self-pay | Source: Home / Self Care | Attending: Unknown Physician Specialty

## 2018-10-22 ENCOUNTER — Ambulatory Visit: Payer: BC Managed Care – PPO | Admitting: Anesthesiology

## 2018-10-22 DIAGNOSIS — E669 Obesity, unspecified: Secondary | ICD-10-CM | POA: Diagnosis not present

## 2018-10-22 DIAGNOSIS — M659 Synovitis and tenosynovitis, unspecified: Secondary | ICD-10-CM | POA: Diagnosis present

## 2018-10-22 DIAGNOSIS — M65862 Other synovitis and tenosynovitis, left lower leg: Secondary | ICD-10-CM | POA: Diagnosis not present

## 2018-10-22 DIAGNOSIS — Z6830 Body mass index (BMI) 30.0-30.9, adult: Secondary | ICD-10-CM | POA: Insufficient documentation

## 2018-10-22 HISTORY — PX: KNEE ARTHROSCOPY: SHX127

## 2018-10-22 SURGERY — ARTHROSCOPY, KNEE
Anesthesia: General | Site: Knee | Laterality: Left

## 2018-10-22 MED ORDER — OXYCODONE HCL 5 MG/5ML PO SOLN: 5.0000 mg | Freq: Once | ORAL | Status: AC | PRN

## 2018-10-22 MED ORDER — CEFAZOLIN SODIUM-DEXTROSE 2-3 GM-%(50ML) IV SOLR
INTRAVENOUS | Status: DC | PRN
  Administered 2018-10-22: 2 g via INTRAVENOUS

## 2018-10-22 MED ORDER — GLYCOPYRROLATE 0.2 MG/ML IJ SOLN
INTRAMUSCULAR | Status: DC | PRN
  Administered 2018-10-22: 0.1 mg via INTRAVENOUS

## 2018-10-22 MED ORDER — ACETAMINOPHEN 160 MG/5ML PO SOLN: 325.0000 mg | ORAL | Status: DC | PRN

## 2018-10-22 MED ORDER — FENTANYL CITRATE (PF) 100 MCG/2ML IJ SOLN
INTRAMUSCULAR | Status: DC | PRN
  Administered 2018-10-22 (×4): 25 ug via INTRAVENOUS

## 2018-10-22 MED ORDER — BUPIVACAINE HCL (PF) 0.5 % IJ SOLN
INTRAMUSCULAR | Status: DC | PRN
  Administered 2018-10-22: 15 mL

## 2018-10-22 MED ORDER — NORCO 5-325 MG PO TABS: 1.0000 | ORAL_TABLET | Freq: Four times a day (QID) | ORAL | 0 refills | Status: AC | PRN

## 2018-10-22 MED ORDER — ONDANSETRON HCL 4 MG/2ML IJ SOLN: 4.0000 mg | Freq: Once | INTRAMUSCULAR | Status: DC | PRN

## 2018-10-22 MED ORDER — LACTATED RINGERS IV SOLN
INTRAVENOUS | Status: DC
  Administered 2018-10-22: 10:00:00 via INTRAVENOUS

## 2018-10-22 MED ORDER — DEXAMETHASONE SODIUM PHOSPHATE 4 MG/ML IJ SOLN
INTRAMUSCULAR | Status: DC | PRN
  Administered 2018-10-22: 4 mg via INTRAVENOUS

## 2018-10-22 MED ORDER — LIDOCAINE HCL (CARDIAC) PF 100 MG/5ML IV SOSY
PREFILLED_SYRINGE | INTRAVENOUS | Status: DC | PRN
  Administered 2018-10-22: 30 mg via INTRATRACHEAL

## 2018-10-22 MED ORDER — ACETAMINOPHEN 325 MG PO TABS: 325.0000 mg | ORAL_TABLET | ORAL | Status: DC | PRN

## 2018-10-22 MED ORDER — PROPOFOL 10 MG/ML IV BOLUS
INTRAVENOUS | Status: DC | PRN
  Administered 2018-10-22: 200 mg via INTRAVENOUS

## 2018-10-22 MED ORDER — MIDAZOLAM HCL 5 MG/5ML IJ SOLN
INTRAMUSCULAR | Status: DC | PRN
  Administered 2018-10-22: 2 mg via INTRAVENOUS

## 2018-10-22 MED ORDER — ONDANSETRON HCL 4 MG/2ML IJ SOLN
INTRAMUSCULAR | Status: DC | PRN
  Administered 2018-10-22: 4 mg via INTRAVENOUS

## 2018-10-22 MED ORDER — OXYCODONE HCL 5 MG PO TABS
5.0000 mg | ORAL_TABLET | Freq: Once | ORAL | Status: AC | PRN
  Administered 2018-10-22: 5 mg via ORAL

## 2018-10-22 MED ORDER — CEFAZOLIN SODIUM-DEXTROSE 2-3 GM-%(50ML) IV SOLR: INTRAVENOUS | Status: DC | PRN

## 2018-10-22 MED ORDER — FENTANYL CITRATE (PF) 100 MCG/2ML IJ SOLN: 25.0000 ug | INTRAMUSCULAR | Status: DC | PRN

## 2018-10-22 SURGICAL SUPPLY — 27 items
BLADE ABRADER 4.5 (BLADE) ×2 IMPLANT
BLADE FULL RADIUS 3.5 (BLADE) ×2 IMPLANT
BLADE SHAVER 4.5X7 STR FR (MISCELLANEOUS) ×2 IMPLANT
BNDG ESMARK 6X12 TAN STRL LF (GAUZE/BANDAGES/DRESSINGS) ×2 IMPLANT
COVER LIGHT HANDLE UNIVERSAL (MISCELLANEOUS) ×4 IMPLANT
CUFF TOURN SGL QUICK 30 (TOURNIQUET CUFF) ×1
CUFF TRNQT CYL 30X4X21-28X (TOURNIQUET CUFF) ×1 IMPLANT
DRAPE LEGGINS SURG 28X43 STRL (DRAPES) ×2 IMPLANT
DURAPREP 26ML APPLICATOR (WOUND CARE) ×4 IMPLANT
GAUZE SPONGE 4X4 12PLY STRL (GAUZE/BANDAGES/DRESSINGS) ×2 IMPLANT
GLOVE BIO SURGEON STRL SZ8 (GLOVE) ×2 IMPLANT
GLOVE BIOGEL M STRL SZ7.5 (GLOVE) ×2 IMPLANT
GLOVE INDICATOR 8.0 STRL GRN (GLOVE) ×2 IMPLANT
GOWN STRL REIN 2XL XLG LVL4 (GOWN DISPOSABLE) ×4 IMPLANT
IV LACTATED RINGER IRRG 3000ML (IV SOLUTION) ×4
IV LR IRRIG 3000ML ARTHROMATIC (IV SOLUTION) ×4 IMPLANT
KIT TURNOVER KIT A (KITS) ×2 IMPLANT
MANIFOLD 4PT FOR NEPTUNE1 (MISCELLANEOUS) ×2 IMPLANT
PACK ARTHROSCOPY KNEE (MISCELLANEOUS) ×2 IMPLANT
SET TUBE SUCT SHAVER OUTFL 24K (TUBING) ×2 IMPLANT
SOL PREP PVP 2OZ (MISCELLANEOUS) ×2
SOLUTION PREP PVP 2OZ (MISCELLANEOUS) ×1 IMPLANT
SUT ETHILON 3-0 FS-10 30 BLK (SUTURE) ×2
SUTURE EHLN 3-0 FS-10 30 BLK (SUTURE) ×1 IMPLANT
TAPE MICROFOAM 4IN (TAPE) ×2 IMPLANT
TUBING ARTHRO INFLOW-ONLY STRL (TUBING) ×2 IMPLANT
WRAP KNEE W/COLD PACKS 25.5X14 (SOFTGOODS) ×2 IMPLANT

## 2018-10-22 NOTE — Anesthesia Procedure Notes (Addendum)
Procedure Name: LMA Insertion Date/Time: 10/22/2018 12:07 PM Performed by: Maree KrabbeWarren, Ryli Standlee, CRNA Pre-anesthesia Checklist: Patient identified, Emergency Drugs available, Suction available, Patient being monitored and Timeout performed Patient Re-evaluated:Patient Re-evaluated prior to induction Oxygen Delivery Method: Circle system utilized Preoxygenation: Pre-oxygenation with 100% oxygen Induction Type: IV induction Ventilation: Mask ventilation without difficulty LMA: LMA inserted LMA Size: 4.0 Number of attempts: 1 Placement Confirmation: positive ETCO2 and breath sounds checked- equal and bilateral Tube secured with: Tape Dental Injury: Teeth and Oropharynx as per pre-operative assessment

## 2018-10-22 NOTE — Anesthesia Preprocedure Evaluation (Signed)
Anesthesia Evaluation  Patient identified by MRN, date of birth, ID band Patient awake    Reviewed: Allergy & Precautions, H&P , NPO status , Patient's Chart, lab work & pertinent test results  Airway Mallampati: II  TM Distance: >3 FB Neck ROM: full    Dental no notable dental hx.    Pulmonary    Pulmonary exam normal breath sounds clear to auscultation       Cardiovascular Normal cardiovascular exam Rhythm:regular Rate:Normal     Neuro/Psych    GI/Hepatic   Endo/Other    Renal/GU      Musculoskeletal   Abdominal   Peds  Hematology   Anesthesia Other Findings   Reproductive/Obstetrics                             Anesthesia Physical Anesthesia Plan  ASA: II  Anesthesia Plan: General LMA   Post-op Pain Management:    Induction:   PONV Risk Score and Plan: 2 and Ondansetron, Dexamethasone and Treatment may vary due to age or medical condition  Airway Management Planned:   Additional Equipment:   Intra-op Plan:   Post-operative Plan:   Informed Consent: I have reviewed the patients History and Physical, chart, labs and discussed the procedure including the risks, benefits and alternatives for the proposed anesthesia with the patient or authorized representative who has indicated his/her understanding and acceptance.     Plan Discussed with: CRNA  Anesthesia Plan Comments:         Anesthesia Quick Evaluation

## 2018-10-22 NOTE — Op Note (Addendum)
Patient: Tyler Wyatt   Preoperative diagnosis: Status post remote left knee medial Makoplasty with residual catching sensation  Postop diagnosis: Status post remote left knee medial Makoplasty with reactive synovitis  Operation: Arthroscopic limited synovectomy and chondral debridement  Surgeon: Randon GoldsmithHarold B Schawn Byas, MD  Anesthesia: Gen.   History: Patient's had history of left knee discomfort and catching aftera remote left knee medial Makoplasty procedure.  The plain films revealed maintenance of satisfactory position of his medial Makoplasty components.  The patient had an MRI which revealed no obvious stress reaction or fracture.The patient was scheduled for surgery due to persistent discomfort despite conservative treatment.  The patient was taken the operating room where satisfactory general anesthesia was achieved.  The patient was given 2 g of Kefzol IV prior to the start of the procedure.  A tourniquet and leg holder were was applied to the left thigh.  Left lower extremity was exsanguinated and the tourniquet was inflated.  A well leg support was applied to the nonoperative extremity. The left knee was prepped and draped in usual fashion for an arthroscopic procedure. An inflow cannula was introduced superomedially. The joint was distended with lactated Ringer's. Scope was introduced through an inferolateral puncture wound and a probe through an inferomedial puncture wound. Inspection of the medial compartment revealed no loose bodies.  Medial Makoplasty components were stable..  There was a mild reactive synovitis about the femoral component which I debrided with a synovial resector.  Inspection of the intercondylar notch revealed  intact cruciates. Inspection of the the lateral compartment revealed minimal chondral fraying..  I used a turbo whisker to debride the few areas of fraying.  The lateral meniscus was intact.  The trochlear groove was inspected.  Again, there were a few  areas of chondral fraying that were debrided with the turbowhisker.  Inspection of the retropatellar surface revealed some chondral fraying that once again was debrided with the turbowhisker. I observed patella tracking from the inferlateral portal. The patella seemed to track fairly well.  The instruments were removed from the joint at this time.  Tourniquet was released.  It had been up for 62 minutes.  The puncture wounds were closed with 3-0 nylon in vertical mattress fashion. I injected each puncture wound with several cc of half percent Marcaine without epinephrine. Betadine was applied to the wounds followed by sterile dressing. An ice pack was applied to the left knee. The patient was awakened and transferred to the stretcher bed. The patient was taken to the recovery room in satisfactory condition.  Blood loss was negligible.

## 2018-10-22 NOTE — Anesthesia Postprocedure Evaluation (Signed)
Anesthesia Post Note  Patient: Tyler Wyatt  Procedure(s) Performed: ARTHROSCOPY KNEE (Left Knee)  Patient location during evaluation: PACU Anesthesia Type: General Level of consciousness: awake and alert and oriented Pain management: satisfactory to patient Vital Signs Assessment: post-procedure vital signs reviewed and stable Respiratory status: spontaneous breathing, nonlabored ventilation and respiratory function stable Cardiovascular status: blood pressure returned to baseline and stable Postop Assessment: Adequate PO intake and No signs of nausea or vomiting Anesthetic complications: no    Cherly BeachStella, Orianna Biskup J

## 2018-10-22 NOTE — Transfer of Care (Signed)
Immediate Anesthesia Transfer of Care Note  Patient: Tyler Wyatt  Procedure(s) Performed: ARTHROSCOPY KNEE (Left Knee)  Patient Location: PACU  Anesthesia Type: General LMA  Level of Consciousness: awake, alert  and patient cooperative  Airway and Oxygen Therapy: Patient Spontanous Breathing and Patient connected to supplemental oxygen  Post-op Assessment: Post-op Vital signs reviewed, Patient's Cardiovascular Status Stable, Respiratory Function Stable, Patent Airway and No signs of Nausea or vomiting  Post-op Vital Signs: Reviewed and stable  Complications: No apparent anesthesia complications

## 2018-10-22 NOTE — H&P (Signed)
  H and P reviewed. No changes. Uploaded at later date. 

## 2018-10-22 NOTE — Discharge Instructions (Signed)
General Anesthesia, Adult, Care After °This sheet gives you information about how to care for yourself after your procedure. Your health care provider may also give you more specific instructions. If you have problems or questions, contact your health care provider. °What can I expect after the procedure? °After the procedure, the following side effects are common: °· Pain or discomfort at the IV site. °· Nausea. °· Vomiting. °· Sore throat. °· Trouble concentrating. °· Feeling cold or chills. °· Weak or tired. °· Sleepiness and fatigue. °· Soreness and body aches. These side effects can affect parts of the body that were not involved in surgery. °Follow these instructions at home: ° °For at least 24 hours after the procedure: °· Have a responsible adult stay with you. It is important to have someone help care for you until you are awake and alert. °· Rest as needed. °· Do not: °? Participate in activities in which you could fall or become injured. °? Drive. °? Use heavy machinery. °? Drink alcohol. °? Take sleeping pills or medicines that cause drowsiness. °? Make important decisions or sign legal documents. °? Take care of children on your own. °Eating and drinking °· Follow any instructions from your health care provider about eating or drinking restrictions. °· When you feel hungry, start by eating small amounts of foods that are soft and easy to digest (bland), such as toast. Gradually return to your regular diet. °· Drink enough fluid to keep your urine pale yellow. °· If you vomit, rehydrate by drinking water, juice, or clear broth. °General instructions °· If you have sleep apnea, surgery and certain medicines can increase your risk for breathing problems. Follow instructions from your health care provider about wearing your sleep device: °? Anytime you are sleeping, including during daytime naps. °? While taking prescription pain medicines, sleeping medicines, or medicines that make you drowsy. °· Return to  your normal activities as told by your health care provider. Ask your health care provider what activities are safe for you. °· Take over-the-counter and prescription medicines only as told by your health care provider. °· If you smoke, do not smoke without supervision. °· Keep all follow-up visits as told by your health care provider. This is important. °Contact a health care provider if: °· You have nausea or vomiting that does not get better with medicine. °· You cannot eat or drink without vomiting. °· You have pain that does not get better with medicine. °· You are unable to pass urine. °· You develop a skin rash. °· You have a fever. °· You have redness around your IV site that gets worse. °Get help right away if: °· You have difficulty breathing. °· You have chest pain. °· You have blood in your urine or stool, or you vomit blood. °Summary °· After the procedure, it is common to have a sore throat or nausea. It is also common to feel tired. °· Have a responsible adult stay with you for the first 24 hours after general anesthesia. It is important to have someone help care for you until you are awake and alert. °· When you feel hungry, start by eating small amounts of foods that are soft and easy to digest (bland), such as toast. Gradually return to your regular diet. °· Drink enough fluid to keep your urine pale yellow. °· Return to your normal activities as told by your health care provider. Ask your health care provider what activities are safe for you. °This information is not   intended to replace advice given to you by your health care provider. Make sure you discuss any questions you have with your health care provider. Document Released: 01/19/2001 Document Revised: 05/29/2017 Document Reviewed: 05/29/2017 Elsevier Interactive Patient Education  2019 [employer]    [facility]  Alda BertholdHarold B. Audie Wieser, [name], M.D. [phone]   KNEE ARTHROSCOPY POST OPERATION  INSTRUCTIONS:  PLEASE READ THESE INSTRUCTIONS ABOUT POST OPERATION CARE. THEY WILL ANSWER MOST OF YOUR QUESTIONS.  You have been given a prescription for pain. Please take as directed for pain.  You can walk, keeping the knee slightly stiff-avoid doing too much bending the first day. (if ACL reconstruction is performed, keep brace locked in extension when walking.)  You will use crutches or cane if needed. Can weight bear as tolerated  Plan to take three to four days off from work. You can resume work when you are comfortable. (This can be a week or more, depending on the type of work you do.)  To reduce pain and swelling, place one to two pillows under the knee the first two or three days when sitting or lying. An ice pack may be placed on top of the area over the dressing. Instructions for making homemade icepack are as follow:  Flexible homemade alcohol water ice pack  2 cups water  1 cup rubbing alcohol  food coloring for the blue tint (optional)  2 zip-top bags - gallon-size  Mix the water and alcohol together in one of your zip-top bags and add food coloring. Release as much air as possible and seal the bag. Place in freezer for at least 12 hours.  The small incisions in your knee are closed with nylon stitches. They will be removed in the office.  The bulky dressing may be removed in the third day after surgery. (If ACL surgery-DO NOT REMOVE BANDAGES). Put a waterproof band-aid over each stitch. Do not put any creams or ointments on wounds. KEEP INCISIONS CLEAN AND DRY UNTIL YOU RETURN TO THE OFFICE.  Sometimes the operative area remains somewhat painful and swollen for several weeks. This is usually nothing to worry about, but call if you have any excessive symptoms, especially fever. It is not unusual to have a low grade fever of 99 degrees for the first few days. If persist after 3-4 days call the office. It is not uncommon for the pain to be a little worse on the third day after surgery.   Begin doing gentle exercises right away. They will be limited by the amount of pain and swelling you have.  Exercising will reduce the swelling, increase motion, and prevent muscle weakness. Exercises: Straight leg raising and gentle knee bending.   Avoid strenuous athletics for a minimum of 4 to 6 weeks after arthroscopic surgery (approximately five months if ACL surgery).  If the surgery included ACL reconstruction the brace that is supplied to the extremity post surgery is to be locked in extension when you are asleep and is to be locked in extension when you are ambulating. It can be unlocked for exercises or sitting.  Keep your post surgery appointment that has been made for you. If you do not remember the date call [phone]. Your follow up appointment should be between 7-10 days.

## 2018-10-25 ENCOUNTER — Encounter: Payer: Self-pay | Admitting: Unknown Physician Specialty

## 2020-10-03 ENCOUNTER — Other Ambulatory Visit: Payer: Self-pay | Admitting: Orthopedic Surgery

## 2020-10-03 DIAGNOSIS — T84038A Mechanical loosening of other internal prosthetic joint, initial encounter: Secondary | ICD-10-CM

## 2020-10-03 DIAGNOSIS — Z96659 Presence of unspecified artificial knee joint: Secondary | ICD-10-CM

## 2020-10-17 ENCOUNTER — Ambulatory Visit: Admission: RE | Admit: 2020-10-17 | Payer: BC Managed Care – PPO | Source: Ambulatory Visit

## 2020-10-29 ENCOUNTER — Encounter
Admission: RE | Admit: 2020-10-29 | Discharge: 2020-10-29 | Disposition: A | Discharge status: Home / Self Care | Payer: BC Managed Care – PPO | Source: Ambulatory Visit | Attending: Orthopedic Surgery | Admitting: Orthopedic Surgery

## 2020-10-29 ENCOUNTER — Other Ambulatory Visit: Payer: BC Managed Care – PPO

## 2020-10-29 ENCOUNTER — Other Ambulatory Visit: Payer: Self-pay

## 2020-10-29 DIAGNOSIS — T84038A Mechanical loosening of other internal prosthetic joint, initial encounter: Secondary | ICD-10-CM | POA: Diagnosis present

## 2020-10-29 DIAGNOSIS — Z96659 Presence of unspecified artificial knee joint: Secondary | ICD-10-CM | POA: Diagnosis present

## 2020-10-29 MED ORDER — TECHNETIUM TC 99M MEDRONATE IV KIT
20.0000 | PACK | Freq: Once | INTRAVENOUS | Status: AC | PRN
  Administered 2020-10-29: 21.948 via INTRAVENOUS

## 2024-06-24 NOTE — Progress Notes (Signed)
 Referring Physician:  No referring provider defined for this encounter.  Primary Physician:  Merilee, L.Addie, MD (Inactive)  History of Present Illness: 06/29/2024 Tyler Wyatt is here today with a chief complaint of back pain radiating into his bilateral lower extremities.  He has had this for multiple years but it continues to get worse.  He gets numbness tingling coldness in the right foot as well as the left leg.  This goes down the back of bilateral legs in a low lumbar/sacral distribution.  He is not having any bowel or bladder dysfunction.  He has not had any recent physical therapy or any recent injections.  He does have a history of spinal injections in 2021.  He is also taking some anti-inflammatories.  He feels like his issues are worsened with activity.  They tend to improve with rest.  He does get some heaviness in his legs.  Is quite severe and can impact the quality of his life.  Conservative measures:  Physical therapy:  has not participated in Multimodal medical therapy including regular antiinflammatories: Celebrex Injections: had epidural steroid injections in 2021 at [facility]  Past Surgery:  none  The symptoms are causing a significant impact on the patient's life.   I have utilized the care everywhere function in epic to review the outside records available from external health systems.  Review of Systems:  A 10 point review of systems is negative, except for the pertinent positives and negatives detailed in the HPI.  Past Medical History: Past Medical History:  Diagnosis Date   Hyperlipidemia     Past Surgical History: Past Surgical History:  Procedure Laterality Date   CHOLECYSTECTOMY     KNEE ARTHROSCOPY Left 02/06/2017   Procedure: ARTHROSCOPY KNEE With partial medial meniscectomy left knee plus chondral debridement and coblation;  Surgeon: Helayne Glenn, MD;  Location: [facility];  Service: Orthopedics;  Laterality: Left;  [facility]  [facility] [employer]   KNEE ARTHROSCOPY Left 10/22/2018   Procedure: ARTHROSCOPY KNEE;  Surgeon: Glenn Helayne, MD;  Location: [facility];  Service: Orthopedics;  Laterality: Left;  [facility] [employer]   SHOULDER ARTHROSCOPY     VASECTOMY      Allergies: Allergies as of 06/29/2024   (No Known Allergies)    Medications:  Current Outpatient Medications:    celecoxib (CELEBREX) 400 MG capsule, Take 400 mg by mouth daily., Disp: , Rfl:    rosuvastatin (CRESTOR) 10 MG tablet, Take 10 mg by mouth daily., Disp: , Rfl:   Social History: Social History   Tobacco Use   Smoking status: Never   Smokeless tobacco: Never  Substance Use Topics   Alcohol use: Yes    Comment: 1x/month   Drug use: No    Family Medical History: Family History  Problem Relation Age of Onset   Cancer Mother        lung    Physical Examination: Vitals:   06/29/24 1405  BP: (!) 146/92    General: Patient is in no apparent distress. Attention to examination is appropriate.  Neck:   Supple.  Full range of motion.  Respiratory: Patient is breathing without any difficulty.   NEUROLOGICAL:     Awake, alert, oriented to person, place, and time.  Speech is clear and fluent.   Cranial Nerves: Pupils equal round and reactive to light.  Facial tone is symmetric.  Facial sensation is symmetric. Shoulder shrug is symmetric. Tongue protrusion is midline.    Strength:  Side Iliopsoas  Quads Hamstring PF DF EHL  R 5 5 5 5 5 5   L 5 5 5 5 5 5   He does have some significant decrease in reflexes especially in the bilateral Achilles and bilateral medial hamstrings.  He does appear to have continued bilateral knee/patellar reflexes.  Positive straight leg raise bilaterally.  Left worse than right.     No evidence of dysmetria noted.  Antalgic  Imaging: No recent imaging to review, attempting to obtain his imaging from his outside evaluation from a few years back.  Medical Decision  Making/Assessment and Plan: Tyler Wyatt is a pleasant 63 y.o. male with chronic back pain and posterior radiating leg pain.  He states that he gets pain that radiates from his back down into his buttocks.  Goes down the back and posterior parts of his legs down to the bottom of his feet.  States that this has been getting worse over the past few months but has been present and getting worse over the past few years.  He has been treating this conservatively.  He has had previous injections.  Previous home workout/exercises to help with his back pain and core.  Given his worsening symptomatology, I like to get flexion-extension x-rays of his lumbar spine to evaluate for any spondylolisthesis or spondylosis.  He will likely need cross-sectional imaging given his ongoing neuropathic symptoms which are likely related to a lumbar radiculopathy versus lumbar claudication.  He had decreased reflexes in his medial hamstrings and Achilles which indicate he likely has some stenosis at the level of L5 from below.  He did have per assistant patellar reflexes which argue against a higher lumbar lesion however we will be able to clarify this once we have more imaging.  Given his ongoing worsening symptoms I like to have him evaluated for lumbar spondylosis.  Will likely need to initiate physical therapy.  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  Would like to update his imaging, has been well over a year since he is at his last imaging.  Will plan on getting lumbar flexion-extension x-rays, should this show any spondylosis given his extended time with symptomatology decreased reflexes I like to get a lumbar MRI without contrast to evaluate for any lumbar stenosis secondary to spondylosis.  There are no diagnoses linked to this encounter.   Thank you for involving me in the care of this patient.    Penne MICAEL Sharps MD/MSCR Neurosurgery

## 2024-06-29 ENCOUNTER — Ambulatory Visit: Admitting: Neurosurgery

## 2024-06-29 ENCOUNTER — Ambulatory Visit
Admission: RE | Admit: 2024-06-29 | Discharge: 2024-06-29 | Disposition: A | Discharge status: Home / Self Care | Source: Ambulatory Visit | Attending: Neurosurgery | Admitting: Neurosurgery

## 2024-06-29 ENCOUNTER — Ambulatory Visit
Admission: RE | Admit: 2024-06-29 | Discharge: 2024-06-29 | Disposition: A | Discharge status: Home / Self Care | Attending: Neurosurgery | Admitting: Neurosurgery

## 2024-06-29 ENCOUNTER — Encounter: Payer: Self-pay | Admitting: Neurosurgery

## 2024-06-29 VITALS — BP 146/92 | Ht 72.0 in | Wt 226.0 lb

## 2024-06-29 DIAGNOSIS — M48062 Spinal stenosis, lumbar region with neurogenic claudication: Secondary | ICD-10-CM

## 2024-06-29 DIAGNOSIS — M5410 Radiculopathy, site unspecified: Secondary | ICD-10-CM | POA: Insufficient documentation

## 2024-06-29 DIAGNOSIS — M549 Dorsalgia, unspecified: Secondary | ICD-10-CM

## 2024-06-29 DIAGNOSIS — M5416 Radiculopathy, lumbar region: Secondary | ICD-10-CM

## 2024-06-29 DIAGNOSIS — G8929 Other chronic pain: Secondary | ICD-10-CM

## 2024-07-13 ENCOUNTER — Inpatient Hospital Stay
Admission: RE | Admit: 2024-07-13 | Discharge: 2024-07-13 | Disposition: A | Discharge status: Home / Self Care | Payer: Self-pay | Source: Ambulatory Visit | Attending: Neurosurgery | Admitting: Neurosurgery

## 2024-07-13 ENCOUNTER — Other Ambulatory Visit: Payer: Self-pay | Admitting: Family Medicine

## 2024-07-13 DIAGNOSIS — Z049 Encounter for examination and observation for unspecified reason: Secondary | ICD-10-CM

## 2024-07-19 ENCOUNTER — Ambulatory Visit: Payer: Self-pay | Admitting: Neurosurgery

## 2024-08-10 ENCOUNTER — Ambulatory Visit: Admitting: Neurosurgery

## 2024-08-10 DIAGNOSIS — M4716 Other spondylosis with myelopathy, lumbar region: Secondary | ICD-10-CM

## 2024-08-10 DIAGNOSIS — M5410 Radiculopathy, site unspecified: Secondary | ICD-10-CM

## 2024-08-10 MED ORDER — GABAPENTIN 300 MG PO CAPS: ORAL_CAPSULE | ORAL | 0 refills | Status: AC

## 2024-08-10 NOTE — Progress Notes (Signed)
 I had a follow-up phone call with Tyler Wyatt.  He was at home and I was in the office.  He gave consent to go forth with a phone visit.  He continues to have worsening lower extremity issues.  These are noticeably worse when he is driving or sitting in single positions for long time.  If he walks or stays standing for significant amount of time this can also worsen.  His imaging was significant and showed multiple level spondylosis previously, this is almost 39 to 63 years old and he has had worsening symptoms.  Will plan on having him undergo a repeat lumbar MRI, we discussed the use of gabapentin for neuropathic pain.  Will also plan on making referral to physical therapy for low back physical therapy and lumbar spondylosis.  Will look forward to following up with him after some of this therapy and his imaging has been performed.  Spent a total of 10 minutes on the phone call today.

## 2024-08-13 ENCOUNTER — Ambulatory Visit
Admission: RE | Admit: 2024-08-13 | Discharge: 2024-08-13 | Disposition: A | Source: Ambulatory Visit | Attending: Neurosurgery | Admitting: Neurosurgery

## 2024-08-13 DIAGNOSIS — M4716 Other spondylosis with myelopathy, lumbar region: Secondary | ICD-10-CM | POA: Insufficient documentation

## 2024-08-13 DIAGNOSIS — M5410 Radiculopathy, site unspecified: Secondary | ICD-10-CM | POA: Diagnosis present

## 2024-08-18 ENCOUNTER — Telehealth: Payer: Self-pay | Admitting: Neurosurgery

## 2024-08-18 NOTE — Telephone Encounter (Signed)
 Patient called to let our office know that his MRI results are back and to find out what the next step is in his treatment plan. Please advise.

## 2024-08-19 NOTE — Telephone Encounter (Signed)
 I spoke to patient and he has not started PT. He is wanting to go over the MRI before doing PT to be certain it is ok to start. A phone appointment has been made.

## 2024-08-22 ENCOUNTER — Ambulatory Visit: Payer: Self-pay | Admitting: Neurosurgery

## 2024-08-22 NOTE — Telephone Encounter (Signed)
 I spoke to patient and informed him that he will need to start PT. He states he still wants to have the video visit and talk about the MRI before deciding on PT.

## 2024-08-24 ENCOUNTER — Telehealth: Admitting: Neurosurgery

## 2024-08-24 DIAGNOSIS — M4716 Other spondylosis with myelopathy, lumbar region: Secondary | ICD-10-CM

## 2024-08-24 NOTE — Progress Notes (Unsigned)
 I had a follow-up video visit with Tyler Wyatt today.  He gave consent to go forward with a a video visit to discuss his physical therapy.  He was at home and I was in the office.  We discussed his ongoing symptoms.  He feels like his walking is getting worse.  He is having difficulty getting out of his bed.  He also notes that he is tripping more often.  His lower extremity down to the bottom and top of his foot continues to be significant in both its impact and causing issues with his quality of life and ability to perform his activities of daily living.   Given his worsening symptomatology, I would like to have have him come in for an in person evaluation.  Some concern that he is continuing to worsen with his rest and conservative management.  Will plan to have a full evaluation in person next week.  Spent a total of 10 minutes on the video visit today.  Penne MICAEL Sharps, MD

## 2024-08-31 ENCOUNTER — Encounter: Payer: Self-pay | Admitting: Neurosurgery

## 2024-08-31 ENCOUNTER — Ambulatory Visit: Admitting: Neurosurgery

## 2024-08-31 VITALS — BP 144/88 | Ht 72.0 in | Wt 226.0 lb

## 2024-08-31 DIAGNOSIS — M5410 Radiculopathy, site unspecified: Secondary | ICD-10-CM

## 2024-08-31 DIAGNOSIS — M4726 Other spondylosis with radiculopathy, lumbar region: Secondary | ICD-10-CM | POA: Diagnosis not present

## 2024-08-31 DIAGNOSIS — M4716 Other spondylosis with myelopathy, lumbar region: Secondary | ICD-10-CM

## 2024-09-26 NOTE — Progress Notes (Signed)
 Referring Physician:  No referring provider defined for this encounter.  Primary Physician:  Merilee, L.Addie, MD (Inactive)  History of Present Illness: 08/31/24 Tyler Wyatt is here today with a chief complaint of back pain radiating into his bilateral lower extremities.  He has had this for multiple years but it continues to get worse.  He gets numbness tingling coldness in the right foot as well as the left leg.  He is not having any bowel or bladder dysfunction. His pain is much worse while sitting or resting and his leg pain improves when he's up and around and moving He has not had any recent physical therapy or any recent injections.  He does have a history of spinal injections in 2021.  He is also taking some anti-inflammatories. Is quite severe and can impact the quality of his life.  Conservative measures:  Physical therapy:  has not participated in Multimodal medical therapy including regular antiinflammatories: Celebrex Injections: had epidural steroid injections in 2021 at [facility]  Past Surgery:  none  The symptoms are causing a significant impact on the patient's life.   I have utilized the care everywhere function in epic to review the outside records available from external health systems.  Review of Systems:  A 10 point review of systems is negative, except for the pertinent positives and negatives detailed in the HPI.  Past Medical History: Past Medical History:  Diagnosis Date   Hyperlipidemia     Past Surgical History: Past Surgical History:  Procedure Laterality Date   CHOLECYSTECTOMY     KNEE ARTHROSCOPY Left 02/06/2017   Procedure: ARTHROSCOPY KNEE With partial medial meniscectomy left knee plus chondral debridement and coblation;  Surgeon: Helayne Glenn, MD;  Location: [facility];  Service: Orthopedics;  Laterality: Left;  Rob Bagwell/ [facility] [employer] [facility]   KNEE ARTHROSCOPY Left 10/22/2018   Procedure: ARTHROSCOPY KNEE;  Surgeon:  Glenn Helayne, MD;  Location: [facility];  Service: Orthopedics;  Laterality: Left;  Merikay Lesniewski & NEPHEW   SHOULDER ARTHROSCOPY     VASECTOMY      Allergies: Allergies as of 08/31/2024   (No Known Allergies)    Medications:  Current Outpatient Medications:    celecoxib (CELEBREX) 400 MG capsule, Take 400 mg by mouth daily., Disp: , Rfl:    gabapentin  (NEURONTIN ) 300 MG capsule, Take 1 capsule (300 mg total) by mouth at bedtime for 14 days, THEN 1 capsule (300 mg total) 2 (two) times daily for 14 days, THEN 1 capsule (300 mg total) 3 (three) times daily., Disp: 132 capsule, Rfl: 0   rosuvastatin (CRESTOR) 10 MG tablet, Take 10 mg by mouth daily., Disp: , Rfl:   Social History: Social History   Tobacco Use   Smoking status: Never   Smokeless tobacco: Never  Substance Use Topics   Alcohol use: Yes    Comment: 1x/month   Drug use: No    Family Medical History: Family History  Problem Relation Age of Onset   Cancer Mother        lung    Physical Examination: Vitals:   08/31/24 1445  BP: (!) 144/88    General: Patient is in no apparent distress. Attention to examination is appropriate.  Neck:   Supple.  Full range of motion.  Respiratory: Patient is breathing without any difficulty.   NEUROLOGICAL:     Awake, alert, oriented to person, place, and time.  Speech is clear and fluent.   Cranial Nerves: Pupils equal round and reactive  to light.  Facial tone is symmetric.  Facial sensation is symmetric. Shoulder shrug is symmetric. Tongue protrusion is midline.    Strength:  Side Iliopsoas Quads Hamstring PF DF EHL  R 5 5 5 5 5 5   L 5 5 5 5 5 5   He does have some significant decrease in reflexes especially in the bilateral Achilles and bilateral medial hamstrings and patellas.    SLR Negative     No evidence of dysmetria noted.  Antalgic  Imaging: Narrative & Impression  EXAM: MRI LUMBAR SPINE 08/13/2024 10:25:16 AM   TECHNIQUE: Multiplanar  multisequence MRI of the lumbar spine was performed without the administration of intravenous contrast.   COMPARISON: 11/20/2020   CLINICAL HISTORY: Lumbar radiculopathy, symptoms persist with > 6 wks treatment; Chronic back pain/radiculopathy, known compression, worsening symptoms despite rest and activity modification. Complaint of back pain radiating into his bilateral lower extremities. He has had this for multiple years but it continues to get worse. He gets numbness tingling coldness in the right foot as well as the left leg. This goes down the back of bilateral legs in a low lumbar/sacral distribution. He is not having any bowel or bladder dysfunction.   FINDINGS:   BONES AND ALIGNMENT: Normal alignment. Normal vertebral body heights. Bone marrow signal is unremarkable. L3 hemangioma unchanged.   SPINAL CORD: The conus terminates normally.   SOFT TISSUES: No paraspinal mass.   L1-L2: Small disc bulge with mild right lateral recess narrowing. No central spinal canal or neural foraminal stenosis.   L2-L3: Small disc bulge with endplate spurring. Moderate left foraminal stenosis.   L3-L4: Left subarticular disc protrusion with narrowing of the left lateral recess and contact with the descending left L4 nerve root. No central spinal canal stenosis. Moderate bilateral foraminal stenosis.   L4-L5: Right subarticular disc protrusion with narrowing of the right lateral recess and displacement of the right L5 nerve root. Unchanged progression of severe right neural foraminal stenosis. Mild left foraminal stenosis. Mild facet hypertrophy.   L5-S1: Small disc bulge. No central spinal canal or neural foraminal stenosis.   IMPRESSION: 1. L4-L5: Right subarticular disc protrusion with narrowing of the right lateral recess and displacement of the right L5 nerve root, unchanged. Progression of severe right neural foraminal stenosis. Mild left foraminal stenosis. Mild facet  hypertrophy. 2. L3-L4: Left subarticular disc protrusion with narrowing of the left lateral recess and contact with the descending left L4 nerve root. Moderate bilateral foraminal stenosis. 3. L2-L3: Small disc bulge with endplate spurring causing moderate left foraminal stenosis.   Electronically signed by: Franky Stanford MD 08/17/2024 12:38 AM EDT RP Workstation: [id number]      Medical Decision Making/Assessment and Plan: Mr. Barstow is a pleasant 63 y.o. male with chronic back pain and circumferential leg pain. States that this has been getting worse over the past few months but has been present and getting worse over the past few years.  He has been treating this conservatively.  He has had previous injections. His globally decreased reflexes concern me more for neuropathy than radiculopathy.   He worsens when seated or resting, but improves when he's able to be up and around.   He does have spondylosis, but I'm concerned that his symptoms dont completely localize or can be attributed to spondylosis alone, and I'm suspsicious for progressive neuropathy giving the stocking like distribution, lower extremity sensation loss, loss of reflexes and lack of clear claudicatory or radiculopathy type symptoms.   I do feel like a  EMG/NCS will help us  to understand his etiology/pathology as I would like to avoid a major spinal fusion type surgery if not necessary. If testing demonstrates neuropathy will make referral for workup and management, if radiculopathy can discuss surgery but will likely need PT, and if neither will need a vascular workup.   Thank you for involving me in the care of this patient.    Penne MICAEL Sharps MD/MSCR Neurosurgery

## 2024-11-04 ENCOUNTER — Other Ambulatory Visit: Payer: Self-pay

## 2024-11-04 DIAGNOSIS — R202 Paresthesia of skin: Secondary | ICD-10-CM

## 2024-12-19 ENCOUNTER — Encounter: Payer: Self-pay | Admitting: Neurology
